# Patient Record
Sex: Female | Born: 1950 | Race: White | Hispanic: No | Smoking: Never smoker
Health system: Southern US, Community
[De-identification: ages and names within clinical notes are randomized; demographics above are authoritative.]

## PROBLEM LIST (undated history)

## (undated) DIAGNOSIS — H269 Unspecified cataract: Secondary | ICD-10-CM

## (undated) DIAGNOSIS — E785 Hyperlipidemia, unspecified: Secondary | ICD-10-CM

## (undated) DIAGNOSIS — F32A Depression, unspecified: Secondary | ICD-10-CM

## (undated) DIAGNOSIS — F419 Anxiety disorder, unspecified: Secondary | ICD-10-CM

## (undated) DIAGNOSIS — I7090 Unspecified atherosclerosis: Secondary | ICD-10-CM

## (undated) DIAGNOSIS — G709 Myoneural disorder, unspecified: Secondary | ICD-10-CM

## (undated) DIAGNOSIS — M1990 Unspecified osteoarthritis, unspecified site: Secondary | ICD-10-CM

## (undated) DIAGNOSIS — E079 Disorder of thyroid, unspecified: Secondary | ICD-10-CM

## (undated) HISTORY — DX: Unspecified osteoarthritis, unspecified site: M19.90

## (undated) HISTORY — DX: Disorder of thyroid, unspecified: E07.9

## (undated) HISTORY — PX: RHYTIDECTOMY NECK / CHEEK / CHIN: SUR1286

## (undated) HISTORY — DX: Hyperlipidemia, unspecified: E78.5

## (undated) HISTORY — DX: Unspecified cataract: H26.9

## (undated) HISTORY — DX: Unspecified atherosclerosis: I70.90

## (undated) HISTORY — DX: Depression, unspecified: F32.A

## (undated) HISTORY — PX: EYE SURGERY: SHX253

## (undated) HISTORY — PX: COSMETIC SURGERY: SHX468

## (undated) HISTORY — DX: Anxiety disorder, unspecified: F41.9

## (undated) HISTORY — PX: TUBAL LIGATION: SHX77

## (undated) HISTORY — DX: Myoneural disorder, unspecified: G70.9

## (undated) HISTORY — PX: ABDOMINAL HYSTERECTOMY: SHX81

---

## 2018-02-14 HISTORY — PX: OTHER SURGICAL HISTORY: SHX169

## 2020-07-09 HISTORY — PX: EUS: SHX5427

## 2021-02-25 ENCOUNTER — Encounter: Payer: Self-pay | Admitting: Neurology

## 2021-03-05 ENCOUNTER — Encounter: Payer: Self-pay | Admitting: Neurology

## 2021-03-09 ENCOUNTER — Ambulatory Visit (INDEPENDENT_AMBULATORY_CARE_PROVIDER_SITE_OTHER): Payer: Medicare Other | Admitting: Family

## 2021-03-09 ENCOUNTER — Other Ambulatory Visit: Payer: Self-pay

## 2021-03-09 ENCOUNTER — Encounter: Payer: Self-pay | Admitting: Family

## 2021-03-09 VITALS — BP 96/62 | HR 69 | Temp 98.2°F | Ht 61.75 in | Wt 117.6 lb

## 2021-03-09 DIAGNOSIS — E039 Hypothyroidism, unspecified: Secondary | ICD-10-CM | POA: Insufficient documentation

## 2021-03-09 DIAGNOSIS — F4321 Adjustment disorder with depressed mood: Secondary | ICD-10-CM

## 2021-03-09 DIAGNOSIS — Z1231 Encounter for screening mammogram for malignant neoplasm of breast: Secondary | ICD-10-CM | POA: Diagnosis not present

## 2021-03-09 DIAGNOSIS — N390 Urinary tract infection, site not specified: Secondary | ICD-10-CM | POA: Diagnosis not present

## 2021-03-09 DIAGNOSIS — E782 Mixed hyperlipidemia: Secondary | ICD-10-CM | POA: Insufficient documentation

## 2021-03-09 DIAGNOSIS — I700 Atherosclerosis of aorta: Secondary | ICD-10-CM

## 2021-03-09 DIAGNOSIS — Z780 Asymptomatic menopausal state: Secondary | ICD-10-CM

## 2021-03-09 DIAGNOSIS — N951 Menopausal and female climacteric states: Secondary | ICD-10-CM | POA: Insufficient documentation

## 2021-03-09 DIAGNOSIS — K760 Fatty (change of) liver, not elsewhere classified: Secondary | ICD-10-CM | POA: Insufficient documentation

## 2021-03-09 DIAGNOSIS — K8689 Other specified diseases of pancreas: Secondary | ICD-10-CM

## 2021-03-09 LAB — POCT URINALYSIS DIPSTICK
Bilirubin, UA: NEGATIVE
Blood, UA: NEGATIVE
Glucose, UA: NEGATIVE
Ketones, UA: NEGATIVE
Leukocytes, UA: NEGATIVE
Nitrite, UA: NEGATIVE
Protein, UA: NEGATIVE
Spec Grav, UA: 1.01 (ref 1.010–1.025)
Urobilinogen, UA: 0.2 E.U./dL
pH, UA: 8 (ref 5.0–8.0)

## 2021-03-09 LAB — LIPID PANEL
Cholesterol: 154 mg/dL (ref 0–200)
HDL: 54.4 mg/dL (ref >39.00)
LDL Cholesterol: 76 mg/dL (ref 0–99)
NonHDL: 99.56
Total CHOL/HDL Ratio: 3
Triglycerides: 118 mg/dL (ref 0.0–149.0)
VLDL: 23.6 mg/dL (ref 0.0–40.0)

## 2021-03-09 LAB — COMPREHENSIVE METABOLIC PANEL
ALT: 27 U/L (ref 0–35)
AST: 25 U/L (ref 0–37)
Albumin: 4.6 g/dL (ref 3.5–5.2)
Alkaline Phosphatase: 52 U/L (ref 39–117)
BUN: 15 mg/dL (ref 6–23)
CO2: 34 mEq/L — ABNORMAL HIGH (ref 19–32)
Calcium: 9.9 mg/dL (ref 8.4–10.5)
Chloride: 99 mEq/L (ref 96–112)
Creatinine, Ser: 0.65 mg/dL (ref 0.40–1.20)
GFR: 87.75 mL/min (ref >60.00)
Glucose, Bld: 96 mg/dL (ref 70–99)
Potassium: 5.1 mEq/L (ref 3.5–5.1)
Sodium: 139 mEq/L (ref 135–145)
Total Bilirubin: 0.4 mg/dL (ref 0.2–1.2)
Total Protein: 7.2 g/dL (ref 6.0–8.3)

## 2021-03-09 LAB — TSH: TSH: 1.21 u[IU]/mL (ref 0.35–5.50)

## 2021-03-09 LAB — T4, FREE: Free T4: 0.92 ng/dL (ref 0.60–1.60)

## 2021-03-09 MED ORDER — ESTRADIOL 0.1 MG/GM VA CREA: TOPICAL_CREAM | VAGINAL | 0 refills | Status: DC

## 2021-03-09 NOTE — Assessment & Plan Note (Signed)
pt has modified her diet. weight wnl, taking Crestor qd

## 2021-03-09 NOTE — Assessment & Plan Note (Signed)
Chronic - stable on Crestor, rechecking labs today.

## 2021-03-09 NOTE — Assessment & Plan Note (Signed)
reports needing several different abt to cure last bad UTI end of last year, had not had any for years prior to that. Having mild dysuria today w/cloudy urine, will check UA, advised on most likely causes for UTI and she would like to try vaginal estrace as preventative.

## 2021-03-09 NOTE — Assessment & Plan Note (Signed)
w/Hepatic steatosis, denies any current sx, wants to be under GI care, states she was told she needs endoscopy yearly.

## 2021-03-09 NOTE — Patient Instructions (Addendum)
Welcome to [facility]! It was a pleasure meeting you today.  Go to the lab for blood work today.  As discussed, I have ordered your Mammogram and bone density scans. A referral has been sent to gastroenterology.  Please schedule a 3 month follow up visit today for medicine refills.    PLEASE NOTE:  If you had any LAB tests please let us know if you have not heard back within a few days. You may see your results on MyChart before we have a chance to review them but we will give you a call once they are reviewed by [place]. If we ordered any REFERRALS today, please let us know if you have not heard from their office within the next week.  Let us know through MyChart if you are needing REFILLS, or have your pharmacy send [place] the request. You can also use MyChart to communicate with me or any office staff.  Please try these tips to maintain a healthy lifestyle:  Eat most of your calories during the day when you are active. Eliminate processed foods including packaged sweets (pies, cakes, cookies), reduce intake of potatoes, white bread, white pasta, and white rice. Look for whole grain options, oat flour or almond flour.  Each meal should contain half fruits/vegetables, one quarter protein, and one quarter carbs (no bigger than a computer mouse).  Cut down on sweet beverages. This includes juice, soda, and sweet tea. Also watch fruit intake, though this is a healthier sweet option, it still contains natural sugar! Limit to 3 servings daily.  Drink at least 1 glass of water with each meal and aim for at least 8 glasses per day  Exercise at least 150 minutes every week.

## 2021-03-09 NOTE — Assessment & Plan Note (Signed)
vaginal dryness & recurrent UTIs, starting Estrace cream, advised on use & SE.

## 2021-03-09 NOTE — Assessment & Plan Note (Signed)
reports having off & on sx over last 6 years; she relocated here last year, has a very sick son who is moving to this area also soon, and she is having worsening sx. She is interested in psychotherapy - "talk therapy", no meds, as she has had in past with bad SE.

## 2021-03-09 NOTE — Progress Notes (Signed)
New Patient Office Visit  Subjective:  Patient ID: Linda Mcmillan, female    DOB: 19-Jul-1950  Age: 71 y.o. MRN: [id number]  CC:  Chief Complaint  Patient presents with   Establish Care   Vaginal odor    Mild; States that this started in Sept., treated, and sx are returning    Dysuria    Mild; States that this started in Sept., treated, but sx are returning   Labs    She is requesting TSH, A1C, Liver panel   Depression   Hypothyroidism   Hyperlipidemia    HPI Linda Mcmillan presents for establishing care  Hypothyroidism: Patient presents today for followup of Hypothyroidism.  Patient reports positive compliance with daily medication: Synthroid Patient denies any of the following symptoms: fatigue, cold intolerance, constipation, weight gain or inability to lose weight, muscle weakness, mental slowing, dry hair and skin. Urinary symptoms: Patient c/o  dysuria, frequency, urgency, foul odor, cloudy urine. Other sx: vaginal d/c none, vaginal itching none. Duration of sx: 1 week; Home tx: none; Denies  nausea, fever. Reports last UTI 10/2020.   Hyperlipidemia: Patient is currently maintained on the following medication for hyperlipidemia: Crestor. Patient denies myalgia or other side effects. Patient reports good compliance with low fat/low cholesterol diet.  Last lipid panel wnl. Anxiety/Depression: Patient complains of anxiety disorder and mild depression.  She has the following symptoms: feeling overwhelmed, lot of worry, feeling sad.  Onset of symptoms was approximately 6 years ago, She denies current suicidal and homicidal ideation.  Possible organic causes contributing are: none.  Risk factors: negative life event son's illness   Previous treatment includes  different medications  and individual therapy.  She complains of the following side effects from the treatment:  variety of side effects . Depression screen [facility] 2/9 03/09/2021  Decreased Interest 0  Down, Depressed, Hopeless 1   PHQ - 2 Score 1  Altered sleeping 0  Tired, decreased energy 0  Change in appetite 0  Feeling bad or failure about yourself  0  Trouble concentrating 0  Moving slowly or fidgety/restless 0  Suicidal thoughts 0  PHQ-9 Score 1        Past Medical History:  Diagnosis Date   Anxiety 2018   Arthritis Neck, back (herniated lumbar disc   Atherosclerosis    Cataract Surgery, 2020   Depression 2016   Hyperlipidemia 10 years   Neuromuscular disorder (HCC) Restless leg syndrome, 10 years    Past Surgical History:  Procedure Laterality Date   ABDOMINAL HYSTERECTOMY  15 years ago    Family History  Problem Relation Age of Onset   Arthritis Mother    Cancer Mother    Diabetes Mother    Varicose Veins Mother    Alcohol abuse Father    Varicose Veins Maternal Grandmother    ADD / ADHD Son    Alcohol abuse Son    Depression Son    Depression Son     Social History   Socioeconomic History   Marital status: Divorced    Spouse name: Not on file   Number of children: Not on file   Years of education: Not on file   Highest education level: Not on file  Occupational History   Not on file  Tobacco Use   Smoking status: Never   Smokeless tobacco: Not on file  Substance and Sexual Activity   Alcohol use: Never   Drug use: Never   Sexual activity: Not Currently  Other Topics Concern  Not on file  Social History Narrative   Not on file   Social Determinants of Health   Financial Resource Strain: Not on file  Food Insecurity: Not on file  Transportation Needs: Not on file  Physical Activity: Not on file  Stress: Not on file  Social Connections: Not on file  Intimate Partner Violence: Not on file    Objective:   Today's Vitals: BP 96/62    Pulse 69    Temp 98.2 F (36.8 C) (Temporal)    Ht 5' 1.75" (1.568 m)    Wt 117 lb 9.6 oz (53.3 kg)    SpO2 96%    BMI 21.68 kg/m   Physical Exam Vitals and nursing note reviewed.  Constitutional:      Appearance: Normal  appearance.  Cardiovascular:     Rate and Rhythm: Normal rate and regular rhythm.  Pulmonary:     Effort: Pulmonary effort is normal.     Breath sounds: Normal breath sounds.  Musculoskeletal:        General: Normal range of motion.  Skin:    General: Skin is warm and dry.  Neurological:     Mental Status: She is alert.  Psychiatric:        Mood and Affect: Mood normal.        Behavior: Behavior normal.    Assessment & Plan:   Problem List Items Addressed This Visit       Cardiovascular and Mediastinum   Atherosclerosis of aorta [facility])     Digestive   Dilated pancreatic duct    w/Hepatic steatosis, denies any current sx, wants to be under GI care, states she was told she needs endoscopy yearly.       Relevant Orders   Ambulatory referral to Gastroenterology     Endocrine   Acquired hypothyroidism    Chronic - requires brand, Synthroid, reports about a year since last labs, checking today.      Relevant Orders   T4, free (Completed)   TSH (Completed)     Genitourinary   Frequent UTI - Primary    reports needing several different abt to cure last bad UTI end of last year, had not had any for years prior to that. Having mild dysuria today w/cloudy urine, will check UA, advised on most likely causes for UTI and she would like to try vaginal estrace as preventative.      Relevant Orders   POCT Urinalysis Dipstick (Completed)     Other   Mixed hyperlipidemia    Chronic - stable on Crestor, rechecking labs today.      Relevant Orders   Lipid panel (Completed)   Comp Met (CMET) (Completed)   Adjustment disorder with depressed mood    reports having off & on sx over last 6 years; she relocated here last year, has a very sick son who is moving to this area also soon, and she is having worsening sx. She is interested in psychotherapy - "talk therapy", no meds, as she has had in past with bad SE.       Relevant Orders   Ambulatory referral to Psychology    Postmenopausal estrogen deficiency    vaginal dryness & recurrent UTIs, starting Estrace cream, advised on use & SE.      Relevant Medications   estradiol (ESTRACE) 0.1 MG/GM vaginal cream   Other Relevant Orders   DG Bone Density   Other Visit Diagnoses     Encounter for screening mammogram for breast  cancer       Relevant Orders   MM Digital Screening       Outpatient Encounter Medications as of 03/09/2021  Medication Sig   estradiol (ESTRACE) 0.1 MG/GM vaginal cream 0.5 g intravaginally 3 times per week.   No facility-administered encounter medications on file as of 03/09/2021.    Follow-up: No follow-ups on file.   Jeanie Sewer, NP

## 2021-03-09 NOTE — Assessment & Plan Note (Signed)
Chronic - requires brand, Synthroid, reports about a year since last labs, checking today.

## 2021-03-11 NOTE — Progress Notes (Signed)
[facility] Clinic Note - Initial Visit   Date: 03/12/21  Linda Mcmillan MRN: [id number] DOB: [birth year]   Dear Markus Jarvis, MD:  Thank you for your kind referral of Linda Mcmillan for consultation of restless leg syndrome. Although her history is well known to you, please allow [place] to reiterate it for the purpose of our medical record. The patient was accompanied to the clinic by self.   History of Present Illness: Linda Mcmillan is a 71 y.o. right-handed female with hyperlipidemia and hypothyroidism presenting for evaluation of restless leg syndrome.   For the last 12 years, she has restless sensation of the feet.  Symptoms are worse at rest and improved with activity. She has previously tried: pramipexole, oxycodone and was followed at [facility] in 2016. After moving to [place], [place] she established care with Dr. Volanda Napoleon at [facility] and more recently followed by Dr. Lelon Mast.  For the past two years, she has been on gabapentin 600mg  at 6p and 10p, ropinirole 1mg  at 6p, and clonazepam 0.5mg  at bedtime.  Most of the time (95%), her RLS is well-controlled on this regimen. She rarely needs to get up at night to walk around.  She moved to [place] from [place], [place] in December and would like to establish care.  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 She lives alone.    Out-side paper records, electronic medical record, and images have been reviewed where available and summarized as:  Lab Results  Component Value Date   TSH 1.21 03/09/2021   No results found for: ESRSEDRATE, POCTSEDRATE  Past Medical History:  Diagnosis Date   Anxiety 2018   Arthritis Neck, back (herniated lumbar disc   Atherosclerosis    Cataract Surgery, 2020   Depression 2016   Hyperlipidemia 10 years   Neuromuscular disorder ([place]) Restless leg syndrome, 10 years     Past Surgical History:  Procedure Laterality Date   ABDOMINAL HYSTERECTOMY  20 years ago     Medications:  Outpatient Encounter Medications as of 03/12/2021  Medication Sig   clonazePAM (KLONOPIN) 0.5 MG tablet See Instructions, Qty: 180 Tab, Refills: 2, Take 1/2 tab to 2 tabs 30 min before bedtime per instructions from doctor's office, Maintenance, Route to Pharmacy Electronically, [place] [phone], Restless leg syndrome   estradiol (ESTRACE) 0.1 MG/GM vaginal cream 0.5 g intravaginally 3 times per week.   gabapentin (NEURONTIN) 600 MG tablet See Instructions, Qty: 135 Tab, Refills: 3, Take 1/2  tablet at 8 pm and 1 tab at 10 pm, Maintenance, Route to Pharmacy Electronically, [place] [phone]   levothyroxine (SYNTHROID) 75 MCG tablet Synthroid 75 mcg tablet   rOPINIRole (REQUIP) 1 MG tablet See Instructions, Qty: 90 Tab, Refills: 2, TAKE ONE TABLET BY MOUTH EVERY NIGHT AT BEDTIME, 90, Route to Pharmacy Electronically, [place] [phone]   rosuvastatin (CRESTOR) 10 MG tablet Take 1 tablet by mouth daily.   No facility-administered encounter medications on file as of 03/12/2021.    Allergies:  Allergies  Allergen Reactions   Sulfa Antibiotics     Family History: Family History  Problem Relation Age of Onset   Arthritis Mother    Cancer Mother        Colon Cancer at 37 years old   Diabetes Mother    Varicose Veins Mother    Alcohol abuse Father    Other Father  Heavy Smoker   Varicose Veins Maternal Grandmother    Other Son        PTSD from [place] War   ADD / ADHD Son    Alcohol abuse Son    Depression Son    Depression Son     Social History: Social History   Tobacco Use   Smoking status: Never   Smokeless tobacco: Never  Substance Use Topics   Alcohol use: Never   Drug use: Never   Social History   Social History Narrative   Right Handed    Lives in a one story home    Vital Signs:  BP 103/62    Pulse 68    Ht 5' 1.75" (1.568 m)     Wt 118 lb (53.5 kg)    SpO2 97%    BMI 21.76 kg/m    Neurological Exam: MENTAL STATUS including orientation to time, place, person, recent and remote memory, attention span and concentration, language, and fund of knowledge is normal.  Speech is not dysarthric.  CRANIAL NERVES: II:  No visual field defects.  III-IV-VI: Pupils equal round and reactive to light.  Normal conjugate, extra-ocular eye movements in all directions of gaze.  No nystagmus.  No ptosis.   V:  Normal facial sensation.    VII:  Normal facial symmetry and movements.   VIII:  Normal hearing and vestibular function.   IX-X:  Normal palatal movement.   XI:  Normal shoulder shrug and head rotation.   XII:  Normal tongue strength and range of motion, no deviation or fasciculation.  MOTOR: Motor strength is 5/5 throughout.  No atrophy, fasciculations or abnormal movements.  No pronator drift.   MSRs:  Right        Left                  brachioradialis 2+  2+  biceps 2+  2+  triceps 2+  2+  patellar 2+  2+  ankle jerk 2+  2+  Hoffman no  no  plantar response down  down   SENSORY:  Normal and symmetric perception of light touch, pinprick, vibration, and proprioception.  Romberg's sign absent.   COORDINATION/GAIT: Normal finger-to- nose-finger and heel-to-shin.  Intact rapid alternating movements bilaterally.  Gait narrow based and stable. Tandem and stressed gait intact.    IMPRESSION: Restless leg syndrome, well-controlled.  - Previously tried:  pramipexole, oxycodone - Continue gabapentin 600mg  at 6p and 8p - refilled - Continue ropinirole 1mg  at bedtime - refilled - Continue clonazepam 0.5mg  at bedtime - refilled  Return to clinic in 1 year.   Thank you for allowing me to participate in patient's care.  If I can answer any additional questions, I would be pleased to do so.    Sincerely,    Rustyn Conery K. Posey Pronto, DO

## 2021-03-12 ENCOUNTER — Ambulatory Visit (INDEPENDENT_AMBULATORY_CARE_PROVIDER_SITE_OTHER): Payer: Medicare Other | Admitting: Neurology

## 2021-03-12 ENCOUNTER — Encounter: Payer: Self-pay | Admitting: Neurology

## 2021-03-12 ENCOUNTER — Other Ambulatory Visit: Payer: Self-pay

## 2021-03-12 VITALS — BP 103/62 | HR 68 | Ht 61.75 in | Wt 118.0 lb

## 2021-03-12 DIAGNOSIS — G2581 Restless legs syndrome: Secondary | ICD-10-CM | POA: Diagnosis not present

## 2021-03-12 MED ORDER — GABAPENTIN 600 MG PO TABS: ORAL_TABLET | ORAL | 3 refills | Status: DC

## 2021-03-12 MED ORDER — ROPINIROLE HCL 1 MG PO TABS: ORAL_TABLET | ORAL | 3 refills | Status: DC

## 2021-03-12 MED ORDER — CLONAZEPAM 0.5 MG PO TABS: 0.5000 mg | ORAL_TABLET | Freq: Every day | ORAL | 5 refills | Status: DC

## 2021-03-12 NOTE — Patient Instructions (Addendum)
Continue gabapentin 600mg  at 6p and 8p  Continue ropinirole 1mg  at bedtime  Continue clonazepam 0.5mg  at bedtime  Return to clinic in 1 year

## 2021-03-14 ENCOUNTER — Encounter: Payer: Self-pay | Admitting: Family

## 2021-03-14 DIAGNOSIS — R7303 Prediabetes: Secondary | ICD-10-CM

## 2021-03-15 ENCOUNTER — Other Ambulatory Visit: Payer: Self-pay

## 2021-03-15 DIAGNOSIS — Z87898 Personal history of other specified conditions: Secondary | ICD-10-CM

## 2021-03-16 ENCOUNTER — Ambulatory Visit (INDEPENDENT_AMBULATORY_CARE_PROVIDER_SITE_OTHER): Payer: Medicare Other | Admitting: Psychology

## 2021-03-16 ENCOUNTER — Other Ambulatory Visit: Payer: Self-pay

## 2021-03-16 DIAGNOSIS — F4321 Adjustment disorder with depressed mood: Secondary | ICD-10-CM
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
## 2021-03-30 ENCOUNTER — Telehealth: Payer: Self-pay | Admitting: Family

## 2021-03-30 ENCOUNTER — Other Ambulatory Visit: Payer: Self-pay

## 2021-03-30 ENCOUNTER — Encounter: Payer: Self-pay | Admitting: Family

## 2021-03-30 ENCOUNTER — Other Ambulatory Visit (INDEPENDENT_AMBULATORY_CARE_PROVIDER_SITE_OTHER): Payer: Medicare Other

## 2021-03-30 DIAGNOSIS — I700 Atherosclerosis of aorta: Secondary | ICD-10-CM

## 2021-03-30 DIAGNOSIS — R7303 Prediabetes: Secondary | ICD-10-CM

## 2021-03-30 LAB — HEMOGLOBIN A1C: Hgb A1c MFr Bld: 5.9 % (ref 4.6–6.5)

## 2021-03-30 NOTE — Telephone Encounter (Signed)
Patients las DOS 03/09/21. Patient would like to be referred to a cardiologist. Does she need to have another office visit for this referral? She had a cardiologist in [place] and needs to establish care here in [place].

## 2021-03-30 NOTE — Telephone Encounter (Signed)
Lvm to make pt aware of new Cardiology referral.

## 2021-04-01 ENCOUNTER — Ambulatory Visit (INDEPENDENT_AMBULATORY_CARE_PROVIDER_SITE_OTHER): Payer: Medicare Other | Admitting: Psychology

## 2021-04-01 DIAGNOSIS — F4321 Adjustment disorder with depressed mood: Secondary | ICD-10-CM

## 2021-04-01 NOTE — Progress Notes (Signed)
[facility] Counselor/Therapist Progress Note ° °Patient ID: Linda Mcmillan, MRN: [id number]  ° °Date: 04/01/21 ° °Time Spent: 9:03  am - 9:53 am : 50 Minutes ° °Treatment Type: Individual Therapy. ° °Reported Symptoms: Anxiety and Depression. ° °Mental Status Exam: °Appearance:  Casual, Neat, and Well Groomed     °Behavior: Appropriate  °Motor: Normal  °Speech/Language:  Normal Rate  °Affect: Congruent  °Mood: normal  °Thought process: normal  °Thought content:   WNL  °Sensory/Perceptual disturbances:   WNL  °Orientation: oriented to person, place, time/date, and situation  °Attention: Good  °Concentration: Good  °Memory: WNL  °Fund of knowledge:  Good  °Insight:   Good  °Judgment:  Good  °Impulse Control: Good  ° °Risk Assessment: °Danger to Self:  No °Self-injurious Behavior: No °Danger to Others: No °Duty to Warn:no °Physical Aggression / Violence:No  °Access to Firearms a concern: No  °Gang Involvement:No  ° °Subjective:  ° °[name] participated from home, via video and consented to treatment. Therapist participated from home office. We met online due to COVID pandemic. Keidra reviewed the events of the past week. We reviewed numerous treatment approaches including CBT, BA, Problem Solving, and Solution focused therapy. Psych-education regarding the Kellene's diagnosis of Adjustment disorder with depressed mood was provided during the session. We discussed [name]'s goals treatment goals which include to become more social, develop a support system, adjust to her recent move, increase physical activity,  reduce rumination and catastrophization, process past events (hospitalization), and bolster coping skills. . [name] provided verbal approval of the treatment plan.  ° °Interventions: Psycho-education & Goal Setting.  ° °Diagnosis:  °Adjustment disorder with depressed mood ° °Treatment Plan: ° °Client Abilities/Strengths °Seville is self-ware, motivated, and intelligent. ° °Support  System: °Family ° °Client Treatment Preferences °Outpatient Therapy.  ° °Client Statement of Needs °Issabelle would like to become more social, develop a support system, adjust to her recent move, increase physical activity,  reduce rumination and catastrophization,  °process past events (hospitalization), and bolster coping skills.  ° °Treatment Level °Weekly ° °Symptoms ° °Anxiety: difficulty managing worry and worrying too much about different things   (Status: maintained) °Depression: Feeling down   (Status: maintained) ° °Goals:  °[name] experiences symptoms of depression and anxiety. ° ° °Target Date: 04/01/22 Frequency: Weekly  °Progress: 0 Modality: individual  ° ° °Therapist will provide referrals for additional resources as appropriate.  °Therapist will provide psycho-education regarding [name]'s diagnosis and corresponding treatment approaches and interventions. °Licensed Clinical Social Worker, Osman Mostafa, LCSW will support the patient's ability to achieve the goals identified. will employ CBT, BA, Problem-solving, Solution Focused, Mindfulness,  coping skills, & other evidenced-based practices will be used to promote progress towards healthy functioning to help manage decrease symptoms associated with her diagnosis.  ° Reduce overall level, frequency, and intensity of the feelings of depression and anxiety evidenced by decreased overall symptoms from 6 to 7 days/week to 0 to 1 days/week per client report for at least 3 consecutive months. °Verbally express understanding of the relationship between feelings of depression, anxiety and their impact on thinking patterns and behaviors. °Verbalize an understanding of the role that distorted thinking plays in creating fears, excessive worry, and ruminations. ° ° °([name] participated in the creation of the treatment plan) ° °Osman Mostafa, LCSW °

## 2021-04-02 ENCOUNTER — Ambulatory Visit: Payer: Medicare Other | Admitting: Psychology

## 2021-04-06 NOTE — Progress Notes (Signed)
Cardiology Office Note   Date:  04/13/2021   ID:  Linda Mcmillan, DOB 1950/03/20, MRN [id number]  PCP:  Jeanie Sewer, NP  Cardiologist:   Janitza Revuelta Martinique, MD   Chief Complaint  Patient presents with   Hyperlipidemia      History of Present Illness: Linda Mcmillan is a 71 y.o. female who is seen at the request of Jeanie Sewer NP for evaluation of aortic atherosclerosis. She has a history of HLD. She has previously been followed by Cardiology in [place], [place] - Dr Sonny Masters. History of venous insufficiency. Normal ETT and Echo in Jan 2022.   She was told on some scan that she had atherosclerosis of the aortic arch. She brings reports of a number of scans but none involve the chest. She thinks it may have been a MRI scan she had before at [facility]. She moved to [facility] to be with her son who has a number of health issues including a thoracic aneurysm.  She denies any chest pain, dyspnea, palpitations. She is trying to eliminate sugar in her diet and eat healthy. She has joined a gym. She is a nonsmoker.     Past Medical History:  Diagnosis Date   Anxiety 2018   Arthritis Neck, back (herniated lumbar disc   Atherosclerosis    Cataract Surgery, 2020   Depression 2016   Hyperlipidemia 10 years   Neuromuscular disorder ([place]) Restless leg syndrome, 10 years    Past Surgical History:  Procedure Laterality Date   ABDOMINAL HYSTERECTOMY  20 years ago     Current Outpatient Medications  Medication Sig Dispense Refill   clonazePAM (KLONOPIN) 0.5 MG tablet Take 1 tablet (0.5 mg total) by mouth at bedtime. 30 tablet 5   estradiol (ESTRACE) 0.1 MG/GM vaginal cream 0.5 g intravaginally 3 times per week. 42.5 g 0   gabapentin (NEURONTIN) 600 MG tablet Take 1 tablet at 6p and 8p. 180 tablet 3   levothyroxine (SYNTHROID) 75 MCG tablet Synthroid 75 mcg tablet     rOPINIRole (REQUIP) 1 MG tablet Take 1 tablet at 6p 90 tablet 3   rosuvastatin (CRESTOR) 10 MG tablet Take 1 tablet by mouth  daily.     No current facility-administered medications for this visit.    Allergies:   Sulfa antibiotics    Social History:  The patient  reports that she has never smoked. She has never used smokeless tobacco. She reports that she does not drink alcohol and does not use drugs.   Family History:  The patient's family history includes ADD / ADHD in her son; Alcohol abuse in her father and son; Arthritis in her mother; Cancer in her mother; Depression in her son and son; Diabetes in her mother; Other in her father and son; Varicose Veins in her maternal grandmother and mother.    ROS:  Please see the history of present illness.   Otherwise, review of systems are positive for none.   All other systems are reviewed and negative.    PHYSICAL EXAM: VS:  BP 128/66    Pulse 78    Ht 5\' 2"  (1.575 m)    Wt 120 lb 12.8 oz (54.8 kg)    BMI 22.09 kg/m  , BMI Body mass index is 22.09 kg/m. GEN: Well nourished, thin WF, in no acute distress HEENT: normal Neck: no JVD, carotid bruits, or masses Cardiac: RRR; no murmurs, rubs, or gallops,no edema  Respiratory:  clear to auscultation bilaterally, normal work of breathing GI: soft, nontender,  nondistended, + BS MS: no deformity or atrophy Skin: warm and dry, no rash Neuro:  Strength and sensation are intact Psych: anxious mood, full affect   EKG:  EKG is ordered today. The ekg ordered today demonstrates NSR rate 78. Normal Ecg. I have personally reviewed and interpreted this study.    Recent Labs: 03/09/2021: ALT 27; BUN 15; Creatinine, Ser 0.65; Potassium 5.1; Sodium 139; TSH 1.21    Lipid Panel    Component Value Date/Time   CHOL 154 03/09/2021 1120   TRIG 118.0 03/09/2021 1120   HDL 54.40 03/09/2021 1120   CHOLHDL 3 03/09/2021 1120   VLDL 23.6 03/09/2021 1120   LDLCALC 76 03/09/2021 1120      Wt Readings from Last 3 Encounters:  04/13/21 120 lb 12.8 oz (54.8 kg)  03/12/21 118 lb (53.5 kg)  03/09/21 117 lb 9.6 oz (53.3 kg)       Other studies Reviewed: Additional studies/ records that were reviewed today include:   Echo 02/24/20: Narrative Performed by [facility] This result has an attachment that is not available.    Normal left ventricular size and function. EF 63%    Mild left ventricular hypertrophy.    Normal left ventricular diastolic function.    There is mild tricuspid valve regurgitation.    The RVSP is within normal limits estimated at 25 mmHg  ETT 02/24/20: Study Details  The stress study was performed under the direct supervision of LAWRENCE KLINE DO. Procedure indications: HLD, RBBB, VENOUS INSUFFICIENCY   Resting ECG  ECG is abnormal.nsr inc RBBB Resting ECG shows no ST-segment deviation. The ECG shows normal sinus rhythm.   Stress Findings  Stress testing was performed using a Bruce protocol, with the patient reaching stage 4 for 11 min 45 sec to a maximal HR of 157 (105 % of MPHR) 13.4 METS. Overall, the patient's exercise capacity was excellent for their age. The patient experienced no angina during the test. The test was stopped because the patient experienced fatigue and dyspnea. The patient requested the test to be stopped. The patient reported dyspnea and fatigue during the stress test. Symptoms began at minute 10:02 during stress and ended at minute 3:05 during recovery. Blood pressure demonstrated a normal response and heart rate demonstrated a normal response to stress. The patient's heart rate recovery was normal.   Stress ECG  There was no ST-segment deviation noted during stress. Arrhythmias during stress: rare PACs. There were no arrhythmias during recovery. The ECG was negative for ischemia.  ASSESSMENT AND PLAN:  1.  Aortic atherosclerosis based on patient history. Negative cardiac evaluation last year with Echo and ETT. No cardiac symptoms. Recommend lifestyle modification as she is doing. She is on statin which is appropriate. I don't think she needs to take ASA. Will follow up in  one year. 2. HLD on statin- continue   Current medicines are reviewed at length with the patient today.  The patient does not have concerns regarding medicines.  The following changes have been made:  no change  Labs/ tests ordered today include:  No orders of the defined types were placed in this encounter.        Disposition:   FU with me in 1 year  Signed, Brentlee Delage Martinique, MD  04/13/2021 11:39 AM    [facility] [address], [place], [place], [address] Phone [phone], Fax [phone]

## 2021-04-08 ENCOUNTER — Ambulatory Visit (INDEPENDENT_AMBULATORY_CARE_PROVIDER_SITE_OTHER): Payer: Medicare Other | Admitting: Psychology

## 2021-04-08 DIAGNOSIS — F4321 Adjustment disorder with depressed mood: Secondary | ICD-10-CM | POA: Diagnosis not present
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
## 2021-04-13 ENCOUNTER — Encounter: Payer: Self-pay | Admitting: Cardiology

## 2021-04-13 ENCOUNTER — Ambulatory Visit (INDEPENDENT_AMBULATORY_CARE_PROVIDER_SITE_OTHER): Payer: Medicare Other | Admitting: Cardiology

## 2021-04-13 ENCOUNTER — Other Ambulatory Visit: Payer: Self-pay

## 2021-04-13 VITALS — BP 128/66 | HR 78 | Ht 62.0 in | Wt 120.8 lb

## 2021-04-13 DIAGNOSIS — I700 Atherosclerosis of aorta: Secondary | ICD-10-CM | POA: Diagnosis not present

## 2021-04-13 DIAGNOSIS — E7800 Pure hypercholesterolemia, unspecified: Secondary | ICD-10-CM

## 2021-04-15 ENCOUNTER — Ambulatory Visit (INDEPENDENT_AMBULATORY_CARE_PROVIDER_SITE_OTHER): Payer: Medicare Other | Admitting: Psychology

## 2021-04-15 DIAGNOSIS — F4321 Adjustment disorder with depressed mood: Secondary | ICD-10-CM | POA: Diagnosis not present
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
## 2021-04-19 ENCOUNTER — Encounter: Payer: Self-pay | Admitting: Family

## 2021-04-19 MED ORDER — LEVOTHYROXINE SODIUM 75 MCG PO TABS: ORAL_TABLET | ORAL | 1 refills | Status: DC

## 2021-04-20 ENCOUNTER — Ambulatory Visit: Payer: Medicare Other | Admitting: Psychology

## 2021-04-20 ENCOUNTER — Other Ambulatory Visit: Payer: Self-pay

## 2021-04-20 ENCOUNTER — Ambulatory Visit (INDEPENDENT_AMBULATORY_CARE_PROVIDER_SITE_OTHER): Payer: Medicare Other | Admitting: Psychology

## 2021-04-20 DIAGNOSIS — F4321 Adjustment disorder with depressed mood: Secondary | ICD-10-CM | POA: Diagnosis not present
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
## 2021-04-27 ENCOUNTER — Other Ambulatory Visit: Payer: Self-pay

## 2021-04-27 ENCOUNTER — Ambulatory Visit (INDEPENDENT_AMBULATORY_CARE_PROVIDER_SITE_OTHER): Payer: Medicare Other | Admitting: Psychology

## 2021-04-27 DIAGNOSIS — F4321 Adjustment disorder with depressed mood: Secondary | ICD-10-CM | POA: Diagnosis not present

## 2021-04-27 NOTE — Progress Notes (Signed)
[name] Counselor/Therapist Progress Note ? ?Patient ID: Linda Mcmillan, MRN: [id number]  ? ?Date: 04/27/21 ? ?Time Spent: 2:01  pm - 2:53 pm : 52 Minutes ? ?Treatment Type: Individual Therapy. ? ?Reported Symptoms: Anxiety and Depression. ? ?Mental Status Exam: ?Appearance:  Casual, Neat, and Well Groomed     ?Behavior: Appropriate  ?Motor: Normal  ?Speech/Language:  Normal Rate  ?Affect: Congruent  ?Mood: normal  ?Thought process: normal  ?Thought content:   WNL  ?Sensory/Perceptual disturbances:   WNL  ?Orientation: oriented to person, place, time/date, and situation  ?Attention: Good  ?Concentration: Good  ?Memory: WNL  ?Fund of knowledge:  Good  ?Insight:   Good  ?Judgment:  Good  ?Impulse Control: Good  ? ?Risk Assessment: ?Danger to Self:  No ?Self-injurious Behavior: No ?Danger to Others: No ?Duty to Warn:no ?Physical Aggression / Violence:No  ?Access to Firearms a concern: No  ?Gang Involvement:No  ? ?Subjective:  ? ?Linda Mcmillan participated from home, via phone and consented to treatment. Therapist participated from home office. We met online due to [place] pandemic. Linda Mcmillan reviewed the events of the past week.  Linda Mcmillan endorsed daily tearfulness.  She noted "I cry a lot, probably every day" but could not identify a reason for her tearfulness.  We worked on identifying the effects of this on her overall mood and possible contributing factors to her tearfulness.  She noted her worry regarding this being the medication issues specifically her Klonopin prescription due to past experience with oxycodone.  We processed this during the session and therapist encouraged Linda Mcmillan to contact her prescriber to discuss concerns.  We will continue to process through relationship with her sisters and mother.  Linda Mcmillan noted that her mother "never stood up for me" with her father when her father suspected that Linda Mcmillan was not his biological daughter, which was untrue.  Linda Mcmillan discussed her mother's avoidance of  rectifying her addressing this issue and noted her mother's excuses including "well you never got along with anyway".  We continued to process this relationship and her recent transitions which could have resulted in her daily tearfulness.  Linda Mcmillan discussed a feeling of lack of support in the area, as she is a recent transplant, and we worked on processing this as well.  Therapist encouraged Linda Mcmillan to work on identifying her support system within the area, to build relationships, to spend more time outside the home interacting socially.  Linda Mcmillan was engaged and motivated during the session and she expressed commitment towards her goals.  Therapist validated Linda Mcmillan's feelings and provided supportive therapy.  Follow-up was previously scheduled. ? ?Interventions: Interpersonal / Family Systems.  ? ?Diagnosis:  ?Adjustment disorder with depressed mood ? ?Treatment Plan: ? ?Client Abilities/Strengths ?Linda Mcmillan is self-ware, motivated, and intelligent. ? ?Support System: ?Family ? ?Client Treatment Preferences ?Outpatient Therapy.  ? ?Client Statement of Needs ?Linda Mcmillan would like to become more social, develop a support system, adjust to her recent move, increase physical activity,  reduce rumination and catastrophization,  ?process past events (hospitalization), and bolster coping skills.  ? ?Treatment Level ?Weekly ? ?Symptoms ? ?Anxiety: difficulty managing worry and worrying too much about different things   (Status: maintained) ?Depression: Feeling down   (Status: maintained) ? ?Goals:  ?Linda Mcmillan experiences symptoms of depression and anxiety. ? ? ?Target Date: 04/01/22 Frequency: Weekly  ?Progress: 0 Modality: individual  ? ? ?Therapist will provide referrals for additional resources as appropriate.  ?Therapist will provide psycho-education regarding Linda Mcmillan's diagnosis and corresponding treatment approaches and interventions. ?Licensed Clinical  Social Worker, Linda Irish, LCSW will support the patient's ability to  achieve the goals identified. will employ CBT, BA, Problem-solving, Solution Focused, Mindfulness,  coping skills, & other evidenced-based practices will be used to promote progress towards healthy functioning to help manage decrease symptoms associated with her diagnosis.  ? Reduce overall level, frequency, and intensity of the feelings of depression and anxiety evidenced by decreased overall symptoms from 6 to 7 days/week to 0 to 1 days/week per client report for at least 3 consecutive months. ?Verbally express understanding of the relationship between feelings of depression, anxiety and their impact on thinking patterns and behaviors. ?Verbalize an understanding of the role that distorted thinking plays in creating fears, excessive worry, and ruminations. ? ? ?(Linda Mcmillan participated in the creation of the treatment plan) ? ? ?Linda Irish, LCSW ?

## 2021-04-30 ENCOUNTER — Ambulatory Visit: Payer: Self-pay | Admitting: Neurology

## 2021-05-04 ENCOUNTER — Ambulatory Visit (INDEPENDENT_AMBULATORY_CARE_PROVIDER_SITE_OTHER): Payer: Medicare Other | Admitting: Psychology

## 2021-05-04 ENCOUNTER — Other Ambulatory Visit: Payer: Self-pay

## 2021-05-04 DIAGNOSIS — F4321 Adjustment disorder with depressed mood: Secondary | ICD-10-CM

## 2021-05-04 NOTE — Progress Notes (Signed)
[facility] Counselor/Therapist Progress Note ? ?Patient ID: Linda Mcmillan, MRN: [id number]  ? ?Date: 05/04/21 ? ?Time Spent: 2:01  pm - 2:56 pm : 55 Minutes ? ?Treatment Type: Individual Therapy. ? ?Reported Symptoms: Anxiety and Depression. ? ?Mental Status Exam: ?Appearance:  Casual, Neat, and Well Groomed     ?Behavior: Appropriate  ?Motor: Normal  ?Speech/Language:  Normal Rate  ?Affect: Congruent  ?Mood: anxious  ?Thought process: normal  ?Thought content:   WNL  ?Sensory/Perceptual disturbances:   WNL  ?Orientation: oriented to person, place, time/date, and situation  ?Attention: Good  ?Concentration: Good  ?Memory: WNL  ?Fund of knowledge:  Good  ?Insight:   Good  ?Judgment:  Good  ?Impulse Control: Good  ? ?Risk Assessment: ?Danger to Self:  No ?Self-injurious Behavior: No ?Danger to Others: No ?Duty to Warn:no ?Physical Aggression / Violence:No  ?Access to Firearms a concern: No  ?Gang Involvement:No  ? ?Subjective:  ? ?[name] participated in the office with therapist. Janetta reviewed the events of the past week. Deloros noted reflecting on the cost of her current familial strain. She noted reflecting on her living situation and feeling a general state of dissatisfaction. She endorsed anxiety regarding her son's arrival to [facility], for a permanent move. She discussed her specific worries regarding this transition. We discussed additional boundaries, going forward, to attempt to minimize interpersonal stressors.Therapist modeled boundary setting using assertive and positive communication. Celestine continued to endorse tearfulness but noted this occurring less frequently. We worked on exploring this during the session. She discussed her fear regarding this being a result of her medication. Therapist encouraged mindfulness regarding possible side-effects and to contact her prescriber.   Treesa was engaged and motivated during the session and she expressed commitment towards her goals.  Therapist  validated Jule's feelings and provided supportive therapy.  Follow-up was previously scheduled. ? ?Interventions: Interpersonal / Family Systems.  ? ?Diagnosis:  ?Adjustment disorder with depressed mood ? ?Treatment Plan: ? ?Client Abilities/Strengths ?Belissa is self-ware, motivated, and intelligent. ? ?Support System: ?Family ? ?Client Treatment Preferences ?Outpatient Therapy.  ? ?Client Statement of Needs ?Davonna would like to become more social, develop a support system, adjust to her recent move, increase physical activity,  reduce rumination and catastrophization,  ?process past events (hospitalization), and bolster coping skills.  ? ?Treatment Level ?Weekly ? ?Symptoms ? ?Anxiety: difficulty managing worry and worrying too much about different things   (Status: maintained) ?Depression: Feeling down   (Status: maintained) ? ?Goals:  ?Niasha experiences symptoms of depression and anxiety. ? ? ?Target Date: 04/01/22 Frequency: Weekly  ?Progress: 0 Modality: individual  ? ? ?Therapist will provide referrals for additional resources as appropriate.  ?Therapist will provide psycho-education regarding Kuulei's diagnosis and corresponding treatment approaches and interventions. ?Licensed Clinical Social Worker, [name], LCSW will support the patient's ability to achieve the goals identified. will employ CBT, BA, Problem-solving, Solution Focused, Mindfulness,  coping skills, & other evidenced-based practices will be used to promote progress towards healthy functioning to help manage decrease symptoms associated with her diagnosis.  ? Reduce overall level, frequency, and intensity of the feelings of depression and anxiety evidenced by decreased overall symptoms from 6 to 7 days/week to 0 to 1 days/week per client report for at least 3 consecutive months. ?Verbally express understanding of the relationship between feelings of depression, anxiety and their impact on thinking patterns and behaviors. ?Verbalize an  understanding of the role that distorted thinking plays in creating fears, excessive worry, and ruminations. ? ? ?(  Dayna participated in the creation of the treatment plan) ? ? ?[name], LCSW ?

## 2021-05-11 ENCOUNTER — Ambulatory Visit (INDEPENDENT_AMBULATORY_CARE_PROVIDER_SITE_OTHER): Payer: Medicare Other | Admitting: Psychology

## 2021-05-11 ENCOUNTER — Other Ambulatory Visit: Payer: Self-pay

## 2021-05-11 DIAGNOSIS — F4321 Adjustment disorder with depressed mood: Secondary | ICD-10-CM | POA: Diagnosis not present

## 2021-05-11 NOTE — Progress Notes (Signed)
[facility] Counselor/Therapist Progress Note ? ?Patient ID: Linda Mcmillan, MRN: [id number]  ? ?Date: 05/11/21 ? ?Time Spent: 2:02  pm - 2:57 pm : 55 Minutes ? ?Treatment Type: Individual Therapy. ? ?Reported Symptoms: Anxiety and Depression. ? ?Mental Status Exam: ?Appearance:  Casual, Neat, and Well Groomed     ?Behavior: Appropriate  ?Motor: Normal  ?Speech/Language:  Normal Rate  ?Affect: Congruent  ?Mood: anxious  ?Thought process: normal  ?Thought content:   WNL  ?Sensory/Perceptual disturbances:   WNL  ?Orientation: oriented to person, place, time/date, and situation  ?Attention: Good  ?Concentration: Good  ?Memory: WNL  ?Fund of knowledge:  Good  ?Insight:   Good  ?Judgment:  Good  ?Impulse Control: Good  ? ?Risk Assessment: ?Danger to Self:  No ?Self-injurious Behavior: No ?Danger to Others: No ?Duty to Warn:no ?Physical Aggression / Violence:No  ?Access to Firearms a concern: No  ?Gang Involvement:No  ? ?Subjective:  ? ?Linda Mcmillan participated in the office with therapist. Linda Mcmillan reviewed the events of the past week. Linda Mcmillan noted her son's recent move to [facility] and her home. She noted frequent negative self-talk including." "What if I can't?" In regards to helping her son get healthier. She endorsed anxiety, upset stomach, and increased worry. She noted feeling like a victim and She noted the mentality "why [place]?" And "What if I can't fix him?" She noted feeling "powerless" and "putting all this pressure on myself". She endorsed catastrofization regarding her worries.  We worked on processing her feelings during the session Linda Mcmillan coping.  Therapist provided psychoeducation regarding setting boundaries regarding worry and rumination and modeled this during the session.  We worked on setting time limits, employing positive self talk, and being mindful of reasonable timelines for change.  Therapist encouraged Linda Mcmillan to engage in self-care and to engage in building a social network outside of her  children.  Linda Mcmillan was engaged and motivated during the session and she expressed commitment towards her goals.  Therapist praised Linda Mcmillan for her effort and energy and provided supportive therapy. ? ? ?Interventions: CBT & Interpersonal ? ?Diagnosis:  ?Adjustment disorder with depressed mood ? ?Treatment Plan: ? ?Client Abilities/Strengths ?Linda Mcmillan is self-ware, motivated, and intelligent. ? ?Support System: ?Family ? ?Client Treatment Preferences ?Outpatient Therapy.  ? ?Client Statement of Needs ?Linda Mcmillan would like to become more social, develop a support system, adjust to her recent move, increase physical activity,  reduce rumination and catastrophization,  ?process past events (hospitalization), and bolster coping skills.  ? ?Treatment Level ?Weekly ? ?Symptoms ? ?Anxiety: difficulty managing worry and worrying too much about different things   (Status: maintained) ?Depression: Feeling down   (Status: maintained) ? ?Goals:  ?Linda Mcmillan experiences symptoms of depression and anxiety. ? ? ?Target Date: 04/01/22 Frequency: Weekly  ?Progress: 0 Modality: individual  ? ? ?Therapist will provide referrals for additional resources as appropriate.  ?Therapist will provide psycho-education regarding Shaely's diagnosis and corresponding treatment approaches and interventions. ?Licensed Clinical Social Worker, Linda Mcmillan, Linda Mcmillan will support the patient's ability to achieve the goals identified. will employ CBT, BA, Problem-solving, Solution Focused, Mindfulness,  coping skills, & other evidenced-based practices will be used to promote progress towards healthy functioning to help manage decrease symptoms associated with her diagnosis.  ? Reduce overall level, frequency, and intensity of the feelings of depression and anxiety evidenced by decreased overall symptoms from 6 to 7 days/week to 0 to 1 days/week per client report for at least 3 consecutive months. ?Verbally express understanding of the relationship between feelings  of  depression, anxiety and their impact on thinking patterns and behaviors. ?Verbalize an understanding of the role that distorted thinking plays in creating fears, excessive worry, and ruminations. ? ? ?(Linda Mcmillan participated in the creation of the treatment plan) ? ? ?Linda Mcmillan, Linda Mcmillan ?

## 2021-05-18 ENCOUNTER — Ambulatory Visit (INDEPENDENT_AMBULATORY_CARE_PROVIDER_SITE_OTHER): Payer: Medicare Other | Admitting: Psychology

## 2021-05-18 DIAGNOSIS — F4321 Adjustment disorder with depressed mood: Secondary | ICD-10-CM | POA: Diagnosis not present

## 2021-05-18 NOTE — Progress Notes (Signed)
[facility] Counselor/Therapist Progress Note ? ?Patient ID: Linda Mcmillan, MRN: [id number]   ? ?Date: 05/18/21 ? ?Time Spent: 2:07  pm - 3:12 pm : 65 Minutes ? ?Treatment Type: Individual Therapy. ? ?Reported Symptoms: sadness, anxiety, interpersonal stress.  ? ?Mental Status Exam: ?Appearance:  Neat and Well Groomed     ?Behavior: Appropriate  ?Motor: Normal  ?Speech/Language:  Clear and Coherent and Slow  ?Affect: Depressed and Tearful  ?Mood: sad  ?Thought process: normal  ?Thought content:   WNL  ?Sensory/Perceptual disturbances:   WNL  ?Orientation: oriented to person, place, time/date, and situation  ?Attention: Good  ?Concentration: Good  ?Memory: WNL  ?Fund of knowledge:  Good  ?Insight:   Good  ?Judgment:  Good  ?Impulse Control: Good  ? ?Risk Assessment: ?Danger to Self:  No ?Self-injurious Behavior: No ?Danger to Others: No ?Duty to Warn:no ?Physical Aggression / Violence:No  ?Access to Firearms a concern: No  ?Gang Involvement:No  ? ?Subjective:  ? ?Linda Mcmillan participated in the session, in person in the office with the therapist, and consented to treatment. Linda Mcmillan reviewed the events of the past week.  Linda Mcmillan discussed extreme interpersonal stress that resulted in disagreement with her son, [name], who recently moved from up [place].  Linda Mcmillan noted that [name] has moved to her house was largely uneventful but that disagreement occurred due to insurance issues and her attempts to aid in this process.  She noted that her history regarding difficulty receiving feedback from her and noted his negative extreme behavioral receiving any feedback constructive or otherwise.  She described her son as "tightly wound" and noted often becoming verbally demonstrative when he does not get his way or receiving feedback, as previously mentioned.  She noted wanting to move from that home, get her own apartment, and live there for 6 months.  We explored the dynamics during the session and what Linda Mcmillan described as  animosity towards her.  We worked on identifying her previous attempts to manage the stressors using assertiveness and boundary setting.  Linda Mcmillan discussed her worry regarding her son's reaction in regards to setting boundaries, which we explored.  Therapist modeled boundary setting and assertiveness during the session.  Linda Mcmillan endorsed gastrointestinal discomfort and pain due to her disagreement with her son.  Therapist validated and normalized Linda Mcmillan's feelings and experience encouraged Linda Mcmillan to begin identifying her boundaries going forward with her son.  Linda Mcmillan was tearful during the session but presented as motivated and hopeful.  Therapist praised Linda Mcmillan for her effort and energy and provided supportive therapy.  Therapist encouraged Linda Mcmillan to engage in relaxation and self-care. ? ?Interventions: Interpersonal ? ?Diagnosis:   ?Adjustment disorder with depressed mood ? ? ?Client Abilities/Strengths ?Linda Mcmillan is self-ware, motivated, and intelligent. ?  ?Support System: ?Family ?  ?Client Treatment Preferences ?Outpatient Therapy.  ?  ?Client Statement of Needs ?Linda Mcmillan would like to become more social, develop a support system, adjust to her recent move, increase physical activity,  reduce rumination and catastrophization,  ?process past events (hospitalization), and bolster coping skills.  ?  ?Treatment Level ?Weekly ?  ?Symptoms ?  ?Anxiety: difficulty managing worry and worrying too much about different things   (Status: maintained) ?Depression: Feeling down   (Status: maintained) ?  ?Goals:  ?Marlet experiences symptoms of depression and anxiety. ?  ?  ?Target Date: 04/01/22 Frequency: Weekly  ?Progress: 0 Modality: individual  ?  ?  ?Therapist will provide referrals for additional resources as appropriate.  ?Therapist will provide psycho-education regarding Subrina's diagnosis  and corresponding treatment approaches and interventions. ?Licensed Clinical Social Worker, [name], LCSW will support the  patient's ability to achieve the goals identified. will employ CBT, BA, Problem-solving, Solution Focused, Mindfulness,  coping skills, & other evidenced-based practices will be used to promote progress towards healthy functioning to help manage decrease symptoms associated with her diagnosis.  ? Reduce overall level, frequency, and intensity of the feelings of depression and anxiety evidenced by decreased overall symptoms from 6 to 7 days/week to 0 to 1 days/week per client report for at least 3 consecutive months. ?Verbally express understanding of the relationship between feelings of depression, anxiety and their impact on thinking patterns and behaviors. ?Verbalize an understanding of the role that distorted thinking plays in creating fears, excessive worry, and ruminations. ?  ?  ?([name] participated in the creation of the treatment plan) ? ? ? ?[name], LCSW ? ?   ?

## 2021-05-25 ENCOUNTER — Ambulatory Visit (INDEPENDENT_AMBULATORY_CARE_PROVIDER_SITE_OTHER): Payer: Medicare Other | Admitting: Psychology

## 2021-05-25 DIAGNOSIS — F4321 Adjustment disorder with depressed mood: Secondary | ICD-10-CM | POA: Diagnosis not present

## 2021-05-25 NOTE — Progress Notes (Addendum)
[facility] Counselor/Therapist Progress Note ? ?Patient ID: Linda Mcmillan, MRN: [id number]   ? ?Date: 05/25/21 ? ?Time Spent: 2:04  pm - 2:55 pm : 51 Minutes ? ?Treatment Type: Individual Therapy. ? ?Reported Symptoms: sadness, anxiety, interpersonal stress.  ? ?Mental Status Exam: ?Appearance:  NA     ?Behavior: Appropriate  ?Motor: N/a  ?Speech/Language:  Clear and Coherent and Slow  ?Affect: Depressed and Tearful  ?Mood: sad  ?Thought process: normal  ?Thought content:   WNL  ?Sensory/Perceptual disturbances:   WNL  ?Orientation: oriented to person, place, time/date, and situation  ?Attention: Good  ?Concentration: Good  ?Memory: WNL  ?Fund of knowledge:  Good  ?Insight:   Good  ?Judgment:  Good  ?Impulse Control: Good  ? ?Risk Assessment: ?Danger to Self:  No ?Self-injurious Behavior: No ?Danger to Others: No ?Duty to Warn:no ?Physical Aggression / Violence:No  ?Access to Firearms a concern: No  ?Gang Involvement:No  ? ?Subjective:  ? ?[name] participated in the session, via phone, from home and consented to treatment. Therapist participated from home office. Session was held online due to [place] pandemic. Jaquesha reviewed the events of the past week.Takeyla noted continued strain with her son, [name], who struggles to follow-up on health related concerns, work related concerns, and financial concerns.  She noted [name]'s recent move to her house, albeit temporarily, from up [place].  She noted this initially going well but devolving pretty quickly.  She noted the effect on her mood and overall outlook as a result.  She noted her frustration and sadness regarding her son's lack of acceptance of advice and lack of traction on health related concerns, respectively.  We continued to explore boundaries going forward including and specifically financial boundaries due to [place] alluding to her possibly cosigning for her home.  Desha discussed this not being a viable option endorsed significant anxiety  regarding her son's reaction to this said future boundary.  We explored this going forward and processed ways to manage this and communicate positively and assertively.  Therapist modeled this during the session.  Dalene was engaged and motivated during the session and expressed commitment towards her goals.  Therapist praised Didi for her effort and energy and will follow up with her next week.  She continues to benefit from counseling. ? ?Interventions: Interpersonal ? ?Diagnosis:   ?Adjustment disorder with depressed mood ? ? ?Client Abilities/Strengths ?Alyxis is self-ware, motivated, and intelligent. ?  ?Support System: ?Family ?  ?Client Treatment Preferences ?Outpatient Therapy.  ?  ?Client Statement of Needs ?Toi would like to become more social, develop a support system, adjust to her recent move, increase physical activity,  reduce rumination and catastrophization,  ?process past events (hospitalization), and bolster coping skills.  ?  ?Treatment Level ?Weekly ?  ?Symptoms ?  ?Anxiety: difficulty managing worry and worrying too much about different things   (Status: maintained) ?Depression: Feeling down   (Status: maintained) ?  ?Goals:  ?Kemora experiences symptoms of depression and anxiety. ?  ?  ?Target Date: 04/01/22 Frequency: Weekly  ?Progress: 0 Modality: individual  ?  ?  ?Therapist will provide referrals for additional resources as appropriate.  ?Therapist will provide psycho-education regarding Tangi's diagnosis and corresponding treatment approaches and interventions. ?Licensed Clinical Social Worker, [name], LCSW will support the patient's ability to achieve the goals identified. will employ CBT, BA, Problem-solving, Solution Focused, Mindfulness,  coping skills, & other evidenced-based practices will be used to promote progress towards healthy functioning to help manage decrease symptoms  associated with her diagnosis.  ? Reduce overall level, frequency, and intensity of the  feelings of depression and anxiety evidenced by decreased overall symptoms from 6 to 7 days/week to 0 to 1 days/week per client report for at least 3 consecutive months. ?Verbally express understanding of the relationship between feelings of depression, anxiety and their impact on thinking patterns and behaviors. ?Verbalize an understanding of the role that distorted thinking plays in creating fears, excessive worry, and ruminations. ?  ?  ?([name] participated in the creation of the treatment plan) ? ? ? ?[name], LCSW ? ?   ?

## 2021-05-27 ENCOUNTER — Ambulatory Visit
Admission: RE | Admit: 2021-05-27 | Discharge: 2021-05-27 | Disposition: A | Discharge status: Home / Self Care | Payer: Medicare Other | Source: Ambulatory Visit | Attending: Family | Admitting: Family

## 2021-05-27 DIAGNOSIS — Z780 Asymptomatic menopausal state: Secondary | ICD-10-CM

## 2021-05-27 DIAGNOSIS — Z1231 Encounter for screening mammogram for malignant neoplasm of breast: Secondary | ICD-10-CM

## 2021-05-28 ENCOUNTER — Encounter: Payer: Self-pay | Admitting: Family

## 2021-06-01 ENCOUNTER — Ambulatory Visit (INDEPENDENT_AMBULATORY_CARE_PROVIDER_SITE_OTHER): Payer: Medicare Other | Admitting: Psychology

## 2021-06-01 ENCOUNTER — Other Ambulatory Visit: Payer: Self-pay

## 2021-06-01 DIAGNOSIS — F4321 Adjustment disorder with depressed mood: Secondary | ICD-10-CM

## 2021-06-01 NOTE — Progress Notes (Signed)
[facility] Counselor/Therapist Progress Note ? ?Patient ID: Aunika Kirsten, MRN: [id number]   ? ?Date: 06/01/21 ? ?Time Spent: 2:01  pm - 2:37pm : 36 Minutes ? ?Treatment Type: Individual Therapy. ? ?Reported Symptoms: sadness, anxiety, interpersonal stress.  ? ?Mental Status Exam: ?Appearance:  NA     ?Behavior: Appropriate  ?Motor: N/a  ?Speech/Language:  Clear and Coherent and Slow  ?Affect: Depressed and Tearful  ?Mood: sad  ?Thought process: normal  ?Thought content:   WNL  ?Sensory/Perceptual disturbances:   WNL  ?Orientation: oriented to person, place, time/date, and situation  ?Attention: Good  ?Concentration: Good  ?Memory: WNL  ?Fund of knowledge:  Good  ?Insight:   Good  ?Judgment:  Good  ?Impulse Control: Good  ? ?Risk Assessment: ?Danger to Self:  No ?Self-injurious Behavior: No ?Danger to Others: No ?Duty to Warn:no ?Physical Aggression / Violence:No  ?Access to Firearms a concern: No  ?Gang Involvement:No  ? ?Subjective:  ? ?[name] participated in the session, via phone, from home and consented to treatment. Therapist participated from home office. Session was held online due to [place] pandemic. Analynn reviewed the events of the past week. She discussed her son's recent loss of insurance and her frustration related to. We continued to explore her frustration regarding her son's lack of drive to address his health and mental health. She is working on developing a consistent exercise routine & continued disconnection with her family. Therapist discussed the importance of setting boundaries for self and others. Specifically in regards to self and rumination. Therapist reviewed numerous management skills related to including setting time limits, use of health redirection, and being mindful of negative self-talk. Therapist modeled this during the session.  Lorelle was engaged and motivated during the session and expressed commitment towards her goals.  Therapist praised Jayliah for her effort  and energy and will follow up with her next week.  She continues to benefit from counseling. ? ?Interventions: Cognitive Behavioral Therapy and Interpersonal ? ?Diagnosis:   ?Adjustment disorder with depressed mood ? ? ?Client Abilities/Strengths ?Ashe is self-ware, motivated, and intelligent. ?  ?Support System: ?Family ?  ?Client Treatment Preferences ?Outpatient Therapy.  ?  ?Client Statement of Needs ?Anagha would like to become more social, develop a support system, adjust to her recent move, increase physical activity,  reduce rumination and catastrophization,  ?process past events (hospitalization), and bolster coping skills.  ?  ?Treatment Level ?Weekly ?  ?Symptoms ?  ?Anxiety: difficulty managing worry and worrying too much about different things   (Status: maintained) ?Depression: Feeling down   (Status: maintained) ?  ?Goals:  ?Karey experiences symptoms of depression and anxiety. ?  ?  ?Target Date: 04/01/22 Frequency: Weekly  ?Progress: 0 Modality: individual  ?  ?  ?Therapist will provide referrals for additional resources as appropriate.  ?Therapist will provide psycho-education regarding Atalaya's diagnosis and corresponding treatment approaches and interventions. ?Licensed Clinical Social Worker, [name], LCSW will support the patient's ability to achieve the goals identified. will employ CBT, BA, Problem-solving, Solution Focused, Mindfulness,  coping skills, & other evidenced-based practices will be used to promote progress towards healthy functioning to help manage decrease symptoms associated with her diagnosis.  ? Reduce overall level, frequency, and intensity of the feelings of depression and anxiety evidenced by decreased overall symptoms from 6 to 7 days/week to 0 to 1 days/week per client report for at least 3 consecutive months. ?Verbally express understanding of the relationship between feelings of depression, anxiety and their impact on  thinking patterns and behaviors. ?Verbalize  an understanding of the role that distorted thinking plays in creating fears, excessive worry, and ruminations. ?  ?  ?([name] participated in the creation of the treatment plan) ? ? ? ?[name], LCSW ? ?   ?

## 2021-06-02 ENCOUNTER — Other Ambulatory Visit: Payer: Self-pay

## 2021-06-08 ENCOUNTER — Ambulatory Visit (INDEPENDENT_AMBULATORY_CARE_PROVIDER_SITE_OTHER): Payer: Medicare Other | Admitting: Psychology

## 2021-06-08 DIAGNOSIS — F4321 Adjustment disorder with depressed mood: Secondary | ICD-10-CM

## 2021-06-08 NOTE — Progress Notes (Signed)
[facility] Counselor/Therapist Progress Note ? ?Patient ID: Linda Mcmillan, MRN: [id number]   ? ?Date: 06/08/21 ? ?Time Spent: 1:59 pm - 3:04 pm : 65 Minutes ? ?Treatment Type: Individual Therapy. ? ?Reported Symptoms: sadness, anxiety, interpersonal stress.  ? ?Mental Status Exam: ?Appearance:  Neat and Well Groomed     ?Behavior: Appropriate  ?Motor: N/a  ?Speech/Language:  Clear and Coherent  ?Affect: Congruent  ?Mood: sad  ?Thought process: normal  ?Thought content:   WNL  ?Sensory/Perceptual disturbances:   WNL  ?Orientation: oriented to person, place, time/date, and situation  ?Attention: Good  ?Concentration: Good  ?Memory: WNL  ?Fund of knowledge:  Good  ?Insight:   Good  ?Judgment:  Good  ?Impulse Control: Good  ? ?Risk Assessment: ?Danger to Self:  No ?Self-injurious Behavior: No ?Danger to Others: No ?Duty to Warn:no ?Physical Aggression / Violence:No  ?Access to Firearms a concern: No  ?Gang Involvement:No  ? ?Subjective:  ? ?Linda Mcmillan participated in the session, via phone, from home and consented to treatment. Therapist participated from home office. Session was held online due to [place] pandemic. Linda Mcmillan reviewed the events of the past week. She noted feeling disrespected by her children as she attempts to provide some support. We explored this during the session and her attempts to address these concerns via boundaries.We worked on feeling identification during the session. We worked on identifying additional boundaries going forward. We worked on identifying ways to disengage from disagreements. Therapist modeled this during the session. Linda Mcmillan was engaged and motivated during the session and expressed commitment towards her goals.  Therapist praised Linda Mcmillan for her effort and energy and will follow up with her next week.  She continues to benefit from counseling. ? ?Interventions: Interpersonal ? ?Diagnosis:   ?Adjustment disorder with depressed mood ? ? ?Client  Abilities/Strengths ?Linda Mcmillan is self-ware, motivated, and intelligent. ?  ?Support System: ?Family ?  ?Client Treatment Preferences ?Outpatient Therapy.  ?  ?Client Statement of Needs ?Linda Mcmillan would like to become more social, develop a support system, adjust to her recent move, increase physical activity,  reduce rumination and catastrophization,  ?process past events (hospitalization), and bolster coping skills.  ?  ?Treatment Level ?Weekly ?  ?Symptoms ?  ?Anxiety: difficulty managing worry and worrying too much about different things   (Status: maintained) ?Depression: Feeling down   (Status: maintained) ?  ?Goals:  ?Linda Mcmillan experiences symptoms of depression and anxiety. ?  ?  ?Target Date: 04/01/22 Frequency: Weekly  ?Progress: 0 Modality: individual  ?  ?  ?Therapist will provide referrals for additional resources as appropriate.  ?Therapist will provide psycho-education regarding Linda Mcmillan's diagnosis and corresponding treatment approaches and interventions. ?Licensed Clinical Social Worker, [name], LCSW will support the patient's ability to achieve the goals identified. will employ CBT, BA, Problem-solving, Solution Focused, Mindfulness,  coping skills, & other evidenced-based practices will be used to promote progress towards healthy functioning to help manage decrease symptoms associated with her diagnosis.  ? Reduce overall level, frequency, and intensity of the feelings of depression and anxiety evidenced by decreased overall symptoms from 6 to 7 days/week to 0 to 1 days/week per client report for at least 3 consecutive months. ?Verbally express understanding of the relationship between feelings of depression, anxiety and their impact on thinking patterns and behaviors. ?Verbalize an understanding of the role that distorted thinking plays in creating fears, excessive worry, and ruminations. ?  ?  ?(Linda Mcmillan participated in the creation of the treatment plan) ? ? ? ?[name],  LCSW ? ?   ?

## 2021-06-15 ENCOUNTER — Ambulatory Visit (INDEPENDENT_AMBULATORY_CARE_PROVIDER_SITE_OTHER): Payer: Medicare Other | Admitting: Psychology

## 2021-06-15 DIAGNOSIS — F4321 Adjustment disorder with depressed mood: Secondary | ICD-10-CM | POA: Diagnosis not present

## 2021-06-15 NOTE — Progress Notes (Signed)
[facility] Counselor/Therapist Progress Note ? ?Patient ID: Linda Mcmillan, MRN: [id number]   ? ?Date: 06/15/21 ? ?Time Spent: 2:03 pm - 3:00 pm : 57 Minutes ? ?Treatment Type: Individual Therapy. ? ?Reported Symptoms: sadness, anxiety, interpersonal stress.  ? ?Mental Status Exam: ?Appearance:  Neat and Well Groomed     ?Behavior: Appropriate  ?Motor: Normal  ?Speech/Language:  Clear and Coherent  ?Affect: Congruent  ?Mood: sad  ?Thought process: normal  ?Thought content:   WNL  ?Sensory/Perceptual disturbances:   WNL  ?Orientation: oriented to person, place, time/date, and situation  ?Attention: Good  ?Concentration: Good  ?Memory: WNL  ?Fund of knowledge:  Good  ?Insight:   Good  ?Judgment:  Good  ?Impulse Control: Good  ? ?Risk Assessment: ?Danger to Self:  No ?Self-injurious Behavior: No ?Danger to Others: No ?Duty to Warn:no ?Physical Aggression / Violence:No  ?Access to Firearms a concern: No  ?Gang Involvement:No  ? ?Subjective:  ? ?Linda Mcmillan participated in the session, via phone, from home and consented to treatment. Therapist participated from home office. Session was held online due to [place] pandemic. Linda Mcmillan reviewed the events of the past week. She noted continued struggles interacting with her son who lives in the home with her. She noted feelings of frustration regarding her son's inaction regarding his own health and wellbeing. She noted feelings of powerlessness in this area. We began processing this during the session and the effect of this on her mood. Therapist encouraged Linda Mcmillan to identify areas of healthy control and to focus in this area including maintaining boundaries. We discussed enacting this in session going forward and setting boundaries regarding discussions centered around her son's behavior and actions and focusing on areas of control and self-care. Linda Mcmillan was agreeable and motivated towards this goal. Linda Mcmillan was engaged and motivated during the session and expressed  commitment towards her goals.  Therapist praised Linda Mcmillan for her effort and energy and will follow up with her next week.  She continues to benefit from counseling. ? ?Interventions: Interpersonal ? ?Diagnosis:   ?Adjustment disorder with depressed mood ? ? ?Client Abilities/Strengths ?Linda Mcmillan is self-ware, motivated, and intelligent. ?  ?Support System: ?Family ?  ?Client Treatment Preferences ?Outpatient Therapy.  ?  ?Client Statement of Needs ?Linda Mcmillan would like to become more social, develop a support system, adjust to her recent move, increase physical activity,  reduce rumination and catastrophization,  ?process past events (hospitalization), and bolster coping skills.  ?  ?Treatment Level ?Weekly ?  ?Symptoms ?  ?Anxiety: difficulty managing worry and worrying too much about different things   (Status: maintained) ?Depression: Feeling down   (Status: maintained) ?  ?Goals:  ?Linda Mcmillan experiences symptoms of depression and anxiety. ?  ?  ?Target Date: 04/01/22 Frequency: Weekly  ?Progress: 0 Modality: individual  ?  ?  ?Therapist will provide referrals for additional resources as appropriate.  ?Therapist will provide psycho-education regarding Linda Mcmillan's diagnosis and corresponding treatment approaches and interventions. ?Licensed Clinical Social Worker, Linda Irish, LCSW will support the patient's ability to achieve the goals identified. will employ CBT, BA, Problem-solving, Solution Focused, Mindfulness,  coping skills, & other evidenced-based practices will be used to promote progress towards healthy functioning to help manage decrease symptoms associated with her diagnosis.  ? Reduce overall level, frequency, and intensity of the feelings of depression and anxiety evidenced by decreased overall symptoms from 6 to 7 days/week to 0 to 1 days/week per client report for at least 3 consecutive months. ?Verbally express understanding of the relationship  between feelings of depression, anxiety and their impact on  thinking patterns and behaviors. ?Verbalize an understanding of the role that distorted thinking plays in creating fears, excessive worry, and ruminations. ?  ?  ?([name] participated in the creation of the treatment plan) ? ? ? ?Linda Irish, LCSW ? ?   ?

## 2021-06-22 ENCOUNTER — Ambulatory Visit (INDEPENDENT_AMBULATORY_CARE_PROVIDER_SITE_OTHER): Payer: Medicare Other | Admitting: Psychology

## 2021-06-22 DIAGNOSIS — F4321 Adjustment disorder with depressed mood: Secondary | ICD-10-CM

## 2021-06-22 NOTE — Progress Notes (Signed)
[facility] Counselor/Therapist Progress Note ? ?Patient ID: Linda Mcmillan, MRN: [id number]   ? ?Date: 06/22/21 ? ?Time Spent: 2:01 pm - 2:50 pm : 49 Minutes ? ?Treatment Type: Individual Therapy. ? ?Reported Symptoms: sadness, anxiety, interpersonal stress.  ? ?Mental Status Exam: ?Appearance:  Neat and Well Groomed     ?Behavior: Appropriate  ?Motor: Normal  ?Speech/Language:  Clear and Coherent  ?Affect: Congruent  ?Mood: sad  ?Thought process: normal  ?Thought content:   WNL  ?Sensory/Perceptual disturbances:   WNL  ?Orientation: oriented to person, place, time/date, and situation  ?Attention: Good  ?Concentration: Good  ?Memory: WNL  ?Fund of knowledge:  Good  ?Insight:   Good  ?Judgment:  Good  ?Impulse Control: Good  ? ?Risk Assessment: ?Danger to Self:  No ?Self-injurious Behavior: No ?Danger to Others: No ?Duty to Warn:no ?Physical Aggression / Violence:No  ?Access to Firearms a concern: No  ?Gang Involvement:No  ? ?Subjective:  ? ?[name] participated in the session, via phone, from home and consented to treatment. Therapist participated from home office. Session was held online due to [place] pandemic. Ellieana reviewed the events of the past week. Karys noted working on managing her frustration regarding her son's behaviors and decision-making. We processed this and noted her feelings regarding her son's behavior and general expression. She noted her emphasis to engage in socially and to spend more time focused on her own needs.  We worked on identifying activities to engage in that align with her goals of becoming more social. Therapist encouraged Henessy to work on compiling a list of activities she enjoys and activities that she might enjoy. Kendrea was engaged and motivated during the session and expressed commitment towards her goals. We discussed the possibility of creating a schedule of self-care and engagement over the next few weeks. Therapist praised Oakley for her effort and energy  and will follow up with her next week.  She continues to benefit from counseling. ? ?Interventions: Interpersonal and Behavioral Activation ? ?Diagnosis:   ?Adjustment disorder with depressed mood ? ? ?Client Abilities/Strengths ?Colleena is self-ware, motivated, and intelligent. ?  ?Support System: ?Family ?  ?Client Treatment Preferences ?Outpatient Therapy.  ?  ?Client Statement of Needs ?Tyianna would like to become more social, develop a support system, adjust to her recent move, increase physical activity,  reduce rumination and catastrophization,  ?process past events (hospitalization), and bolster coping skills.  ?  ?Treatment Level ?Weekly ?  ?Symptoms ?  ?Anxiety: difficulty managing worry and worrying too much about different things   (Status: maintained) ?Depression: Feeling down   (Status: maintained) ?  ?Goals:  ?Berry experiences symptoms of depression and anxiety. ?  ?  ?Target Date: 04/01/22 Frequency: Weekly  ?Progress: 0 Modality: individual  ?  ?  ?Therapist will provide referrals for additional resources as appropriate.  ?Therapist will provide psycho-education regarding Delcenia's diagnosis and corresponding treatment approaches and interventions. ?Licensed Clinical Social Worker, [name], LCSW will support the patient's ability to achieve the goals identified. will employ CBT, BA, Problem-solving, Solution Focused, Mindfulness,  coping skills, & other evidenced-based practices will be used to promote progress towards healthy functioning to help manage decrease symptoms associated with her diagnosis.  ? Reduce overall level, frequency, and intensity of the feelings of depression and anxiety evidenced by decreased overall symptoms from 6 to 7 days/week to 0 to 1 days/week per client report for at least 3 consecutive months. ?Verbally express understanding of the relationship between feelings of depression, anxiety and  their impact on thinking patterns and behaviors. ?Verbalize an understanding  of the role that distorted thinking plays in creating fears, excessive worry, and ruminations. ?  ?  ?([name] participated in the creation of the treatment plan) ? ? ? ?[name], LCSW ?

## 2021-06-29 ENCOUNTER — Ambulatory Visit (INDEPENDENT_AMBULATORY_CARE_PROVIDER_SITE_OTHER): Payer: Medicare Other | Admitting: Psychology

## 2021-06-29 DIAGNOSIS — F4321 Adjustment disorder with depressed mood: Secondary | ICD-10-CM | POA: Diagnosis not present

## 2021-06-29 NOTE — Progress Notes (Signed)
[facility] Counselor/Therapist Progress Note ? ?Patient ID: Linda Mcmillan, MRN: [id number]   ? ?Date: 06/29/21 ? ?Time Spent: 2:03 pm - 2:54  pm : 51 Minutes ? ?Treatment Type: Individual Therapy. ? ?Reported Symptoms: sadness, anxiety, interpersonal stress.  ? ?Mental Status Exam: ?Appearance:  Neat and Well Groomed     ?Behavior: Appropriate  ?Motor: Normal  ?Speech/Language:  Clear and Coherent  ?Affect: Congruent  ?Mood: anxious and sad  ?Thought process: normal  ?Thought content:   WNL  ?Sensory/Perceptual disturbances:   WNL  ?Orientation: oriented to person, place, time/date, and situation  ?Attention: Good  ?Concentration: Good  ?Memory: WNL  ?Fund of knowledge:  Good  ?Insight:   Good  ?Judgment:  Good  ?Impulse Control: Good  ? ?Risk Assessment: ?Danger to Self:  No ?Self-injurious Behavior: No ?Danger to Others: No ?Duty to Warn:no ?Physical Aggression / Violence:No  ?Access to Firearms a concern: No  ?Gang Involvement:No  ? ?Subjective:  ? ?Linda Mcmillan participated in the session, via phone, from home and consented to treatment. Therapist participated from home office. Session was held online due to [place] pandemic. Linda Mcmillan reviewed the events of the past week. Linda Mcmillan noted her son's intent to move out of the home once the purchase of his new home. We reviewed boundaries, going forward, in regards to her son.  She noted being overwhelmed by her own health needs and needing to address these concerns, which are a barrier to engagement. She noted worry regarding a specific issue with her eyes that needs to be medically addressed. She noted her worry regarding her friend, Jeanell Sparrow, whose health is declining and currently receiving hospice care. She noted her worry and anxiety about her son's health appointment, on the 24th, due to the possibility of not being able ot be helped or [name] not engaging with his care. We worked on exploring this, during the session, her boundaries should [name] not engage in  his own care. Therapist encouraged Zarayah to engage in introspective regarding what it would be like to disconnect from being involved in his care.Therapist validated Linda Mcmillan's feelings and experience. Therapist provided supportive therapy.  She continues to benefit from counseling. ? ?Interventions: Interpersonal ? ?Diagnosis:   ?Adjustment disorder with depressed mood ? ? ?Client Abilities/Strengths ?Linda Mcmillan is self-ware, motivated, and intelligent. ?  ?Support System: ?Family ?  ?Client Treatment Preferences ?Outpatient Therapy.  ?  ?Client Statement of Needs ?Linda Mcmillan would like to become more social, develop a support system, adjust to her recent move, increase physical activity,  reduce rumination and catastrophization,  ?process past events (hospitalization), and bolster coping skills.  ?  ?Treatment Level ?Weekly ?  ?Symptoms ?  ?Anxiety: difficulty managing worry and worrying too much about different things   (Status: declined) ?Depression: Feeling down   (Status: maintained) ?  ?Goals:  ?Tifanny experiences symptoms of depression and anxiety. ?  ?  ?Target Date: 04/01/22 Frequency: Weekly  ?Progress: 0 Modality: individual  ?  ?  ?Therapist will provide referrals for additional resources as appropriate.  ?Therapist will provide psycho-education regarding Sascha's diagnosis and corresponding treatment approaches and interventions. ?Licensed Clinical Social Worker, [name], LCSW will support the patient's ability to achieve the goals identified. will employ CBT, BA, Problem-solving, Solution Focused, Mindfulness,  coping skills, & other evidenced-based practices will be used to promote progress towards healthy functioning to help manage decrease symptoms associated with her diagnosis.  ? Reduce overall level, frequency, and intensity of the feelings of depression and anxiety evidenced by  decreased overall symptoms from 6 to 7 days/week to 0 to 1 days/week per client report for at least 3 consecutive  months. ?Verbally express understanding of the relationship between feelings of depression, anxiety and their impact on thinking patterns and behaviors. ?Verbalize an understanding of the role that distorted thinking plays in creating fears, excessive worry, and ruminations. ?  ?  ?([name] participated in the creation of the treatment plan) ? ? ? ?[name], LCSW ?

## 2021-07-06 ENCOUNTER — Ambulatory Visit: Payer: Medicare Other | Admitting: Psychology

## 2021-07-08 ENCOUNTER — Ambulatory Visit (INDEPENDENT_AMBULATORY_CARE_PROVIDER_SITE_OTHER): Payer: Medicare Other | Admitting: Family Medicine

## 2021-07-08 ENCOUNTER — Telehealth: Payer: Self-pay | Admitting: Family

## 2021-07-08 ENCOUNTER — Encounter: Payer: Self-pay | Admitting: Family Medicine

## 2021-07-08 VITALS — BP 98/60 | HR 72 | Temp 98.2°F | Ht 61.75 in | Wt 117.0 lb

## 2021-07-08 DIAGNOSIS — R300 Dysuria: Secondary | ICD-10-CM | POA: Diagnosis not present

## 2021-07-08 LAB — POCT URINALYSIS DIPSTICK
Bilirubin, UA: NEGATIVE
Blood, UA: POSITIVE
Glucose, UA: NEGATIVE
Nitrite, UA: NEGATIVE
Protein, UA: POSITIVE — AB
Spec Grav, UA: 1.015 (ref 1.010–1.025)
Urobilinogen, UA: 0.2 E.U./dL
pH, UA: 6.5 (ref 5.0–8.0)

## 2021-07-08 MED ORDER — CIPROFLOXACIN HCL 500 MG PO TABS: 500.0000 mg | ORAL_TABLET | Freq: Two times a day (BID) | ORAL | 0 refills | Status: DC

## 2021-07-08 NOTE — Patient Instructions (Signed)
It was very nice to see you today!  Hold rosuvastatin while on cipro Start estrace cream next week.    PLEASE NOTE:  If you had any lab tests please let us know if you have not heard back within a few days. You may see your results on MyChart before we have a chance to review them but we will give you a call once they are reviewed by [place]. If we ordered any referrals today, please let us know if you have not heard from their office within the next week.   Please try these tips to maintain a healthy lifestyle:  Eat most of your calories during the day when you are active. Eliminate processed foods including packaged sweets (pies, cakes, cookies), reduce intake of potatoes, white bread, white pasta, and white rice. Look for whole grain options, oat flour or almond flour.  Each meal should contain half fruits/vegetables, one quarter protein, and one quarter carbs (no bigger than a computer mouse).  Cut down on sweet beverages. This includes juice, soda, and sweet tea. Also watch fruit intake, though this is a healthier sweet option, it still contains natural sugar! Limit to 3 servings daily.  Drink at least 1 glass of water with each meal and aim for at least 8 glasses per day  Exercise at least 150 minutes every week.

## 2021-07-08 NOTE — Telephone Encounter (Signed)
Patient would like to TOC from [facility] to [facility].- loves them both but would like to stay with [facility]-

## 2021-07-08 NOTE — Telephone Encounter (Signed)
Please advise 

## 2021-07-08 NOTE — Progress Notes (Addendum)
Subjective:     Patient ID: Linda Mcmillan, female    DOB: Sep 04, 1950, 71 y.o.   MRN: [id number]  Chief Complaint  Patient presents with   Dysuria    Started this morning    Urinary Urgency    Feels like she need to go, but have not do much Recurrent UTI's     HPI-moved from [place] last yr Dysuria this am-intense.  2 nights ago-freq.  Urgency to go but then nothing- no back pain.  Some lower abd.  No f/c/c/d Gets UTI freq-sept,Jan UTI 06/23/20-e coli macrobid 09/23/20 -ecoli,keb,proteus-keflex 10/26/20-pseudomonas  bbactrim-rx, then keflex, then cipro-helped From May till sept-"never felt totally ok"-intermitt symptoms.   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 - will see GI Dr. Lyndel Safe soon.   Does have vag dryness.  Not SA.  Hasn't started estrogen cream yet.   Health Maintenance Due  Topic Date Due   Hepatitis C Screening  Never done   COLONOSCOPY (Pts 45-15yr Insurance coverage will need to be confirmed)  Never done   DEXA SCAN  Never done    Past Medical History:  Diagnosis Date   Anxiety 2018   Arthritis Neck, back (herniated lumbar disc   Atherosclerosis    Cataract Surgery, 2020   Depression 2016   Hyperlipidemia 10 years   [place] Restless leg syndrome, [place]    Past Surgical History:  Procedure Laterality Date   ABDOMINAL HYSTERECTOMY  20 years ago    Outpatient Medications Prior to Visit  Medication Sig Dispense Refill   clonazePAM (KLONOPIN) 0.5 MG tablet Take 1 tablet (0.5 mg total) by mouth at bedtime. 30 tablet 5   estradiol (ESTRACE) 0.1 MG/GM vaginal cream 0.5 g intravaginally 3 times per week. 42.5 g 0   gabapentin (NEURONTIN) 600 MG tablet Take 1 tablet at 6p and 8p. 180 tablet 3   levothyroxine (SYNTHROID) 75 MCG tablet Synthroid 75 mcg tablet 90 tablet 1   rOPINIRole (REQUIP) 1 MG tablet Take 1 tablet at 6p 90 tablet 3    rosuvastatin (CRESTOR) 10 MG tablet Take 1 tablet by mouth daily.     No facility-administered medications prior to visit.    Allergies  Allergen Reactions   Sulfa Antibiotics Nausea Only and Rash    Other reaction(s): Headache   ROS neg/noncontributory except as noted HPI/below Dry eyes-going to [facility] for eyes. -burned eyes.  Seeing eye surg 5/31.      Objective:     BP 98/60   Pulse 72   Temp 98.2 F (36.8 C) (Temporal)   Ht 5' 1.75" (1.568 m)   Wt 117 lb (53.1 kg)   SpO2 98%   BMI 21.57 kg/m  Wt Readings from Last 3 Encounters:  07/08/21 117 lb (53.1 kg)  04/13/21 120 lb 12.8 oz (54.8 kg)  03/12/21 118 lb (53.5 kg)    Physical Exam   Gen: WDWN NAD wf HEENT: NCAT, conjunctiva not injected, sclera nonicteric NECK:  supple, no thyromegaly, no nodes, no carotid bruits CARDIAC: RRR, S1S2+, no murmur.  LUNGS: CTAB. No wheezes ABDOMEN:  BS+, soft, mildly tender suprapubically. No HSM, no masses. No CVAT EXT:  no edema MSK: no gross abnormalities.  NEURO: A&O x3.  CN II-XII intact.  PSYCH: normal mood. Good eye contact  Jan UA neg Reviewed pt records of uti meds and some cx-spent 34m w/pt reviewing, educating,  devising plan     Results for orders placed or performed in visit on 07/08/21  POCT urinalysis dipstick  Result Value Ref Range   Color, UA yellow    Clarity, UA cloudy    Glucose, UA Negative Negative   Bilirubin, UA neg    Ketones, UA postitve    Spec Grav, UA 1.015 1.010 - 1.025   Blood, UA positive    pH, UA 6.5 5.0 - 8.0   Protein, UA Positive (A) Negative   Urobilinogen, UA 0.2 0.2 or 1.0 E.U./dL   Nitrite, UA neg    Leukocytes, UA Moderate (2+) (A) Negative   Appearance     Odor      Assessment & Plan:   Problem List Items Addressed This Visit   None Visit Diagnoses     Dysuria    -  Primary   Relevant Orders   POCT urinalysis dipstick      Dysuria-has had a lot of problems lately.  May partly be d/t vag dryness, etc.   Will tx w/cipro-hold statin.  Check UA and cx.  Start estrace cream for vaginal/blader health.  Consider urologist if continues.  Answered questions  No orders of the defined types were placed in this encounter.   Wellington Hampshire, MD

## 2021-07-08 NOTE — Telephone Encounter (Signed)
fine by me

## 2021-07-08 NOTE — Telephone Encounter (Signed)
Pcp changed to dr Cherlynn Kaiser- vml for pt to be aware

## 2021-07-09 ENCOUNTER — Telehealth: Payer: Self-pay | Admitting: Family Medicine

## 2021-07-09 ENCOUNTER — Other Ambulatory Visit: Payer: Self-pay | Admitting: *Deleted

## 2021-07-09 LAB — URINE CULTURE
MICRO NUMBER:: 13445135
SPECIMEN QUALITY:: ADEQUATE

## 2021-07-09 MED ORDER — CEPHALEXIN 500 MG PO CAPS
500.0000 mg | ORAL_CAPSULE | Freq: Two times a day (BID) | ORAL | 0 refills | Status: AC
Start: 2021-07-09 — End: 2021-07-16

## 2021-07-09 NOTE — Telephone Encounter (Signed)
Patient notified and verbalized understanding. Rx sent to the pharmacy.

## 2021-07-09 NOTE — Telephone Encounter (Signed)
FYI Patient will be called when results come back.

## 2021-07-09 NOTE — Telephone Encounter (Signed)
Spoke to patient,- patient feels fine - patient does not need to come in per patient - patient would like a call with urinalysis test -    Patient Name: Linda Mcmillan Gender: Female DOB: 01-05-1951 Age: 71 Y 56 M 7 D Return Phone Number: [phone] (Primary) Address: City/ State/ Zip: [place]  [address] Client [place] [facility] [place] Client Site [place] [facility] [place] Contact Type Call Who Is Calling Patient / Member / Family / Caregiver Call Type Triage / Clinical Relationship To Patient Self Return Phone Number [phone] (Primary) Chief Complaint Medication reaction Reason for Call Symptomatic / Request for [place] states they took their medication and now their cheeks are very red. Dr. Cherlynn Kaiser is not listed. Translation No Nurse Assessment Nurse: Anabel Bene, RN, Anderson [place] Date/Time Eilene Ghazi Time): 07/08/2021 8:29:44 PM Confirm and document reason for call. If symptomatic, describe symptoms. ---Caller states she had UTI symptoms and was seen in the office today - prescribed Cipro. Has taken it before with no issues (has sulfa issues). Took her first dose at 52 and second at 2 - now she has no discomfort but noticed her pinks are very red. Redness is flat and is currently spreading to her neck. Does the patient have any new or worsening symptoms? ---Yes Will a triage be completed? ---Yes Related visit to physician within the last 2 weeks? ---Yes Does the PT have any chronic conditions? (i.e. diabetes, asthma, this includes High risk factors for pregnancy, etc.) ---No Is this a behavioral health or substance abuse call? ---No Guidelines Guideline Title Affirmed Question Affirmed Notes Nurse Date/Time (Eastern Time) Rash - Widespread On Drugs [1] Taking new prescription medication AND [2] rash within 4 hours of 1st dose Cher Nakai 07/08/2021 8:35:01 PM Disp.  Time Eilene Ghazi Time) Disposition Final User PLEASE NOTE: All timestamps contained within this report are represented as [place] Standard Time. CONFIDENTIALTY NOTICE: This fax transmission is intended only for the addressee. It contains information that is legally privileged, confidential or otherwise protected from use or disclosure. If you are not the intended recipient, you are strictly prohibited from reviewing, disclosing, copying using or disseminating any of this information or taking any action in reliance on or regarding this information. If you have received this fax in error, please notify us immediately by telephone so that we can arrange for its return to [place]. Phone: [phone], Toll-Free: [phone], Fax: [phone] Page: 2 of 2 Call Id: [id number] 07/08/2021 8:40:32 PM See HCP within 4 Hours (or PCP triage) Yes Anabel Bene, RN, Nell Range Disagree/Comply Comply Caller Understands Yes PreDisposition InappropriateToAsk Care Advice Given Per Guideline SEE HCP (OR PCP TRIAGE) WITHIN 4 HOURS: STOP THE MEDICATION: * Stop the medicine until examined. ANTIHISTAMINE FOR SEVERE ITCHING: * Use diphenhydramine (over-the-counter Benadryl 50 mg PO) or any antihistamine medicine that you might have. CALL BACK IF: * You become worse CARE ADVICE given per Rash - Widespread on Drugs (Adult) guideline * IF OFFICE WILL BE CLOSED AND NO PCP (PRIMARY CARE PROVIDER) SECOND-LEVEL TRIAGE: You need to be seen within the next 3 or 4 hours. A nearby Urgent Care Center [facility]) is often a good source of care. Another choice is to go to the ED. Go sooner if you become worse. Comments User: Rosanna Randy, RN Date/Time Eilene Ghazi Time): 07/08/2021 8:34:01 PM RLS, hypothyroidism, statin User: Rosanna Randy, RN Date/Time Eilene Ghazi Time): 07/08/2021 8:41:10 PM Pt sounds undecided if she will be going to an  UC tonight. Referrals GO TO FACILITY UNDECIDED

## 2021-07-09 NOTE — Telephone Encounter (Signed)
Patient stated that her cheeks were really red and neck had some redness, not really a rash, after second dose of cipro. Patient stated that she took some antihistamine, wanted to know if she should keep taking Cipro. Patient notified that culture results are not back yet and will be notified when they are.

## 2021-07-10 NOTE — Addendum Note (Signed)
Addended by: Wellington Hampshire on: 07/10/2021 04:16 PM   Modules accepted: Orders

## 2021-07-11 ENCOUNTER — Encounter: Payer: Self-pay | Admitting: Family Medicine

## 2021-07-13 ENCOUNTER — Ambulatory Visit (INDEPENDENT_AMBULATORY_CARE_PROVIDER_SITE_OTHER): Payer: Medicare Other | Admitting: Psychology

## 2021-07-13 ENCOUNTER — Other Ambulatory Visit (INDEPENDENT_AMBULATORY_CARE_PROVIDER_SITE_OTHER): Payer: Medicare Other

## 2021-07-13 DIAGNOSIS — R300 Dysuria: Secondary | ICD-10-CM | POA: Diagnosis not present

## 2021-07-13 DIAGNOSIS — F4321 Adjustment disorder with depressed mood: Secondary | ICD-10-CM

## 2021-07-13 LAB — URINALYSIS, ROUTINE W REFLEX MICROSCOPIC
Bilirubin Urine: NEGATIVE
Hgb urine dipstick: NEGATIVE
Ketones, ur: NEGATIVE
Leukocytes,Ua: NEGATIVE
Nitrite: NEGATIVE
RBC / HPF: NONE SEEN (ref 0–?)
Specific Gravity, Urine: 1.01 (ref 1.000–1.030)
Total Protein, Urine: NEGATIVE
Urine Glucose: NEGATIVE
Urobilinogen, UA: 0.2 (ref 0.0–1.0)
pH: 7 (ref 5.0–8.0)

## 2021-07-13 NOTE — Progress Notes (Signed)
[place] Counselor/Therapist Progress Note  Patient ID: Linda Mcmillan, MRN: [id number]    Date: 07/13/21  Time Spent: 2:02 pm - 2:53  pm : 51  Minutes  Treatment Type: Individual Therapy.  Reported Symptoms: sadness, anxiety, interpersonal stress.   Mental Status Exam: Appearance:  Neat and Well Groomed     Behavior: Appropriate  Motor: Normal  Speech/Language:  Clear and Coherent  Affect: Congruent  Mood: anxious and sad  Thought process: normal  Thought content:   WNL  Sensory/Perceptual disturbances:   WNL  Orientation: oriented to person, place, time/date, and situation  Attention: Good  Concentration: Good  Memory: WNL  Fund of knowledge:  Good  Insight:   Good  Judgment:  Good  Impulse Control: Good   Risk Assessment: Danger to Self:  No Self-injurious Behavior: No Danger to Others: No Duty to Warn:no Physical Aggression / Violence:No  Access to Firearms a concern: No  Gang Involvement:No   Subjective:   Linda Mcmillan participated in the session, via phone, from home and consented to treatment. Therapist participated from home office. Session was held online due to [place] pandemic. [name] reviewed the events of the past week. She noted her son's recent medical appointment and not receiving any conclusive evidence for Linda Mcmillan's pain. She noted her worry that her son was dying for the past 5 years but noted more accepting that Linda Mcmillan's health and treatment which is in his control. She noted her own worry regarding her recent labs and is currently awaiting results. She noted wanting to be "peaceful and happy" including frequent travel  but noted needing a partner. She noted an interest in companionship. She noted the recent and unexpected loss of her friend, Linda Mcmillan. She noted frustration and hurt regarding not being able to visit due to his wife, Linda Mcmillan's lack of planning for a trip. We began to explored this during the session and the effect on her mood. Therapist  validated Linda Mcmillan's feelings and experience. Therapist provided supportive therapy.  She continues to benefit from counseling.  Interventions: Grief Therapy and Interpersonal  Diagnosis:   Adjustment disorder with depressed mood   Client Abilities/Strengths Linda Mcmillan is self-ware, motivated, and intelligent.   Support System: Family   Client Treatment Preferences Outpatient Therapy.    Client Statement of Needs Linda Mcmillan would like to become more social, develop a support system, adjust to her recent move, increase physical activity,  reduce rumination and catastrophization,  process past events (hospitalization), and bolster coping skills.    Treatment Level Weekly   Symptoms   Anxiety: difficulty managing worry and worrying too much about different things   (Status: declined) Depression: Feeling down   (Status: maintained)   Goals:  Linda Mcmillan experiences symptoms of depression and anxiety.     Target Date: 04/01/22 Frequency: Weekly  Progress: 0 Modality: individual      Therapist will provide referrals for additional resources as appropriate.  Therapist will provide psycho-education regarding Linda Mcmillan's diagnosis and corresponding treatment approaches and interventions. Licensed Clinical Social Worker, [place], LCSW will support the patient's ability to achieve the goals identified. will employ CBT, BA, Problem-solving, Solution Focused, Mindfulness,  coping skills, & other evidenced-based practices will be used to promote progress towards healthy functioning to help manage decrease symptoms associated with her diagnosis.   Reduce overall level, frequency, and intensity of the feelings of depression and anxiety evidenced by decreased overall symptoms from 6 to 7 days/week to 0 to 1 days/week per client report for at least 3 consecutive  months. Verbally express understanding of the relationship between feelings of depression, anxiety and their impact on thinking patterns and  behaviors. Verbalize an understanding of the role that distorted thinking plays in creating fears, excessive worry, and ruminations.     Linda Mcmillan participated in the creation of the treatment plan)    Linda Irish, LCSW

## 2021-07-20 ENCOUNTER — Ambulatory Visit (INDEPENDENT_AMBULATORY_CARE_PROVIDER_SITE_OTHER): Payer: Medicare Other | Admitting: Psychology

## 2021-07-20 DIAGNOSIS — F4321 Adjustment disorder with depressed mood: Secondary | ICD-10-CM

## 2021-07-20 NOTE — Progress Notes (Signed)
[place] Counselor/Therapist Progress Note  Patient ID: Linda Mcmillan, MRN: [id number]    Date: 07/20/21  Time Spent: 2:02 pm - 2:59  pm : 57  Minutes  Treatment Type: Individual Therapy.  Reported Symptoms: sadness, anxiety, interpersonal stress.   Mental Status Exam: Appearance:  Neat and Well Groomed     Behavior: Appropriate  Motor: Normal  Speech/Language:  Clear and Coherent  Affect: Congruent  Mood: anxious and sad  Thought process: normal  Thought content:   WNL  Sensory/Perceptual disturbances:   WNL  Orientation: oriented to person, place, time/date, and situation  Attention: Good  Concentration: Good  Memory: WNL  Fund of knowledge:  Good  Insight:   Good  Judgment:  Good  Impulse Control: Good   Risk Assessment: Danger to Self:  No Self-injurious Behavior: No Danger to Others: No Duty to Warn:no Physical Aggression / Violence:No  Access to Firearms a concern: No  Gang Involvement:No   Subjective:   Linda Mcmillan participated in the session, via phone, from home and consented to treatment. Therapist participated from home office. Session was held online due to [place] pandemic. Linda Mcmillan reviewed the events of the past week. Linda Mcmillan noted a need to focus on herself since her son moved out to a home nearby. She noted feeling like a "holding pattern" in regards to life decisions. We explored this during the session and discussed ways to set goals during this time until she is able to move. She noted her health being a barrier and noted specific health issues being a determining factor for her move. She noted the local lack of support affecting her mood and ability to feel rooted. She noted feelings of loneliness despie her attempts to form relationships, friendships and otherwise. We explored ways to become more social going forwards. Linda Mcmillan was engaged and motivated during the session and expressed commitment towards our goals. Therapist praised Linda Mcmillan and  provided supportive therapy.   Interventions: Interpersonal  Diagnosis:   Adjustment disorder with depressed mood   Client Abilities/Strengths Linda Mcmillan is self-ware, motivated, and intelligent.   Support System: Family   Client Treatment Preferences Outpatient Therapy.    Client Statement of Needs Linda Mcmillan would like to become more social, develop a support system, adjust to her recent move, increase physical activity,  reduce rumination and catastrophization,  process past events (hospitalization), and bolster coping skills.    Treatment Level Weekly   Symptoms   Anxiety: difficulty managing worry and worrying too much about different things   (Status: declined) Depression: Feeling down   (Status: maintained)   Goals:  Linda Mcmillan experiences symptoms of depression and anxiety.     Target Date: 04/01/22 Frequency: Weekly  Progress: 0 Modality: individual      Therapist will provide referrals for additional resources as appropriate.  Therapist will provide psycho-education regarding Linda Mcmillan's diagnosis and corresponding treatment approaches and interventions. Licensed Clinical Social Worker, [place], LCSW will support the patient's ability to achieve the goals identified. will employ CBT, BA, Problem-solving, Solution Focused, Mindfulness,  coping skills, & other evidenced-based practices will be used to promote progress towards healthy functioning to help manage decrease symptoms associated with her diagnosis.   Reduce overall level, frequency, and intensity of the feelings of depression and anxiety evidenced by decreased overall symptoms from 6 to 7 days/week to 0 to 1 days/week per client report for at least 3 consecutive months. Verbally express understanding of the relationship between feelings of depression, anxiety and their impact on thinking patterns and  behaviors. Verbalize an understanding of the role that distorted thinking plays in creating fears, excessive worry,  and ruminations.     Linda Mcmillan participated in the creation of the treatment plan)    [name], LCSW

## 2021-07-27 ENCOUNTER — Ambulatory Visit: Payer: Medicare Other | Admitting: Psychology

## 2021-07-30 ENCOUNTER — Ambulatory Visit (INDEPENDENT_AMBULATORY_CARE_PROVIDER_SITE_OTHER): Payer: Medicare Other | Admitting: Gastroenterology

## 2021-07-30 ENCOUNTER — Encounter: Payer: Self-pay | Admitting: Gastroenterology

## 2021-07-30 ENCOUNTER — Other Ambulatory Visit (INDEPENDENT_AMBULATORY_CARE_PROVIDER_SITE_OTHER): Payer: Medicare Other

## 2021-07-30 VITALS — BP 122/68 | HR 82 | Ht 61.75 in | Wt 116.5 lb

## 2021-07-30 DIAGNOSIS — K760 Fatty (change of) liver, not elsewhere classified: Secondary | ICD-10-CM | POA: Diagnosis not present

## 2021-07-30 DIAGNOSIS — Z800 Family history of malignant neoplasm of digestive organs: Secondary | ICD-10-CM

## 2021-07-30 DIAGNOSIS — K8689 Other specified diseases of pancreas: Secondary | ICD-10-CM | POA: Diagnosis not present

## 2021-07-30 DIAGNOSIS — Z86010 Personal history of colonic polyps: Secondary | ICD-10-CM

## 2021-07-30 LAB — CBC WITH DIFFERENTIAL/PLATELET
Basophils Absolute: 0 10*3/uL (ref 0.0–0.1)
Basophils Relative: 1 % (ref 0.0–3.0)
Eosinophils Absolute: 0.2 10*3/uL (ref 0.0–0.7)
Eosinophils Relative: 5.3 % — ABNORMAL HIGH (ref 0.0–5.0)
HCT: 40.6 % (ref 36.0–46.0)
Hemoglobin: 13.4 g/dL (ref 12.0–15.0)
Lymphocytes Relative: 40.7 % (ref 12.0–46.0)
Lymphs Abs: 1.7 10*3/uL (ref 0.7–4.0)
MCHC: 33.1 g/dL (ref 30.0–36.0)
MCV: 93.1 fl (ref 78.0–100.0)
Monocytes Absolute: 0.5 10*3/uL (ref 0.1–1.0)
Monocytes Relative: 12.7 % — ABNORMAL HIGH (ref 3.0–12.0)
Neutro Abs: 1.7 10*3/uL (ref 1.4–7.7)
Neutrophils Relative %: 40.3 % — ABNORMAL LOW (ref 43.0–77.0)
Platelets: 244 10*3/uL (ref 150.0–400.0)
RBC: 4.36 Mil/uL (ref 3.87–5.11)
RDW: 13.8 % (ref 11.5–15.5)
WBC: 4.1 10*3/uL (ref 4.0–10.5)

## 2021-07-30 LAB — COMPREHENSIVE METABOLIC PANEL
ALT: 35 U/L (ref 0–35)
AST: 31 U/L (ref 0–37)
Albumin: 4.4 g/dL (ref 3.5–5.2)
Alkaline Phosphatase: 48 U/L (ref 39–117)
BUN: 15 mg/dL (ref 6–23)
CO2: 34 mEq/L — ABNORMAL HIGH (ref 19–32)
Calcium: 10.1 mg/dL (ref 8.4–10.5)
Chloride: 100 mEq/L (ref 96–112)
Creatinine, Ser: 0.67 mg/dL (ref 0.40–1.20)
GFR: 86.87 mL/min (ref >60.00)
Glucose, Bld: 90 mg/dL (ref 70–99)
Potassium: 4.4 mEq/L (ref 3.5–5.1)
Sodium: 140 mEq/L (ref 135–145)
Total Bilirubin: 0.4 mg/dL (ref 0.2–1.2)
Total Protein: 7.3 g/dL (ref 6.0–8.3)

## 2021-07-30 LAB — LIPASE: Lipase: 105 U/L — ABNORMAL HIGH (ref 11.0–59.0)

## 2021-07-30 MED ORDER — SUTAB 1479-225-188 MG PO TABS: 1.0000 | ORAL_TABLET | ORAL | 0 refills | Status: DC

## 2021-07-30 NOTE — Progress Notes (Addendum)
Chief Complaint: To get established  Referring Provider:  Dulce Sellar, NP      ASSESSMENT AND PLAN;   #1. 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 No ampullary masses. GI @[facility]  recommended repeat EUS in 1 year to make sure there are no small masses missed.  #2. H/O polyps/FH CRC (mom at age 73)  #3. Fatty liver with Nl LFTs.  Plan: -EUS (will run it by Dr Mansouraty/Dr Christella Hartigan) -CBC, CMP, lipase, CA19-9, CEA -Colon with sutab.   Discussed risks & benefits of colonoscopy. Risks including rare perforation req laparotomy, bleeding after bx/polypectomy req blood transfusion, rarely missing neoplasms, risks of anesthesia/sedation, rare risk of damage to internal organs. Benefits outweigh the risks. Patient agrees to proceed. All the questions were answered. Pt consents to proceed.  Staff message sent to Dr. Tina Griffiths  Addendum I have discussed with Dr. Christella Hartigan (via staff messaging) He recommends MRCP over EUS at this time Would proceed with the same. RG  HPI:    Linda Mcmillan is a 71 y.o. female  Moved from [place] to [place]  Had CT AP May 2022 @[facility]  showing dilated PD (8 mm) and CBD(9 mm) with subtle hypoattenuating focus at the ampulla ?Etiology. 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  No ampullary mass.  However, she was advised to get repeat EUS in 1 yr to ensure stability and r/o any lesions.  No nausea, vomiting, heartburn, regurgitation, odynophagia or dysphagia.  No significant diarrhea or constipation.  No melena or hematochezia. No unintentional weight loss. No abdominal pain.  No FH panc ca or previous history of pancreatitis. No ETOH.  FH: Mom had colon ca at age 57 (now 94-doing good)   Past GI work-up: EGD/EUS 07/09/2020 ([facility])-report sent for scanning  -No significant pathology in the pancreatic head, genu, body, tail and uncinate process.   -PD dilatation is likely an anatomic variant in  setting of an ansa loop -PD 6 mm in the head of the pancreas, 4 mm genu and 2 mm in the body of pancreas.   -Nl GB, ampulla,PV -Rpt EUS in 1 yr.  CT AP with contrast 06/15/2020 -Mildly dilated CBD (9 mm), PD (8 mm) with subtle hypoattenuating focus at the ampulla-negative present normal duodenal mucosa or underlying mass.  Correlate with lab values permitting obstruction.  Recommend GI consultation. -Mildly distended stomach.  US/USE 10/05/2020 -Fatty liver -Normal gallbladder. CBD 5.7 mm -Visualized portions of pancreas/normal. -Metavir score F1 consistent with mild fibrosis  Pt had 3 colonoscopies (first 1 had 1 polyp, repeat in 3 years.  Then 2 polyps , repeat in 5 years , then most recently October 2018-1 polyp , recommended to repeat in 5 years). Colon 11/2016- polyp  Past Medical History:  Diagnosis Date   Anxiety 2018   Arthritis Neck, back (herniated lumbar disc   Atherosclerosis    Cataract Surgery, 2020   Depression 2016   Hyperlipidemia 10 years   Neuromuscular disorder (HCC) Restless leg syndrome, 10 years    Past Surgical History:  Procedure Laterality Date   ABDOMINAL HYSTERECTOMY  20 years ago   cataract surgery  2020   EYE SURGERY Bilateral     Family History  Problem Relation Age of Onset   Arthritis Mother    Cancer Mother        Colon Cancer at 74 years old   Diabetes Mother    Varicose Veins Mother    Colon cancer Mother  Alcohol abuse Father    Other Father        Heavy Smoker   Varicose Veins Maternal Grandmother    Other Son        PTSD from [place] War   ADD / ADHD Son    Alcohol abuse Son    Depression Son    Depression Son    Breast cancer Paternal Aunt    Rectal cancer Neg Hx    Stomach cancer Neg Hx    Pancreatic cancer Neg Hx    Esophageal cancer Neg Hx     Social History   Tobacco Use   Smoking status: Never   Smokeless tobacco: Never  Vaping Use   Vaping Use: Never used  Substance Use Topics   Alcohol use: Never   Drug  use: Never    Current Outpatient Medications  Medication Sig Dispense Refill   clonazePAM (KLONOPIN) 0.5 MG tablet Take 1 tablet (0.5 mg total) by mouth at bedtime. 30 tablet 5   doxycycline (MONODOX) 50 MG capsule Take by mouth.     estradiol (ESTRACE) 0.1 MG/GM vaginal cream 0.5 g intravaginally 3 times per week. 42.5 g 0   gabapentin (NEURONTIN) 600 MG tablet Take 1 tablet at 6p and 8p. 180 tablet 3   levothyroxine (SYNTHROID) 75 MCG tablet Synthroid 75 mcg tablet 90 tablet 1   rOPINIRole (REQUIP) 1 MG tablet Take 1 tablet at 6p 90 tablet 3   rosuvastatin (CRESTOR) 10 MG tablet Take 1 tablet by mouth daily.     No current facility-administered medications for this visit.    Allergies  Allergen Reactions   Sulfa Antibiotics Nausea Only and Rash    Other reaction(s): Headache    Review of Systems:  Constitutional: Denies fever, chills, diaphoresis, appetite change and fatigue.  HEENT: Denies photophobia, eye pain, redness, hearing loss, ear pain, congestion, sore throat, rhinorrhea, sneezing, mouth sores, neck pain, neck stiffness and tinnitus.   Respiratory: Denies SOB, DOE, cough, chest tightness,  and wheezing.   Cardiovascular: Denies chest pain, palpitations and leg swelling.  Genitourinary: Denies dysuria, urgency, frequency, hematuria, flank pain and difficulty urinating.  Musculoskeletal: Denies myalgias, back pain, joint swelling, arthralgias and gait problem.  Skin: No rash.  Neurological: Denies dizziness, seizures, syncope, weakness, light-headedness, numbness and headaches.  Hematological: Denies adenopathy. Easy bruising, personal or family bleeding history  Psychiatric/Behavioral: Has anxiety, no depression     Physical Exam:    BP 122/68   Pulse 82   Ht 5' 1.75" (1.568 m)   Wt 116 lb 8 oz (52.8 kg)   SpO2 96%   BMI 21.48 kg/m  Wt Readings from Last 3 Encounters:  07/30/21 116 lb 8 oz (52.8 kg)  07/08/21 117 lb (53.1 kg)  04/13/21 120 lb 12.8 oz (54.8  kg)   Constitutional:  Well-developed, in no acute distress. Psychiatric: Normal mood and affect. Behavior is normal. HEENT: Pupils normal.  Conjunctivae are normal. No scleral icterus. Cardiovascular: Normal rate, regular rhythm. No edema Pulmonary/chest: Effort normal and breath sounds normal. No wheezing, rales or rhonchi. Abdominal: Soft, nondistended. Nontender. Bowel sounds active throughout. There are no masses palpable. No hepatomegaly. Rectal: Deferred Neurological: Alert and oriented to person place and time. Skin: Skin is warm and dry. No rashes noted.  Data Reviewed: I have personally reviewed following labs and imaging studies  CBC:     No data to display          CMP:    Latest Ref Rng &  Units 03/09/2021   11:20 AM  CMP  Glucose 70 - 99 mg/dL 96   BUN 6 - 23 mg/dL 15   Creatinine 9.52 - 1.20 mg/dL 8.41   Sodium 324 - 401 mEq/L 139   Potassium 3.5 - 5.1 mEq/L 5.1   Chloride 96 - 112 mEq/L 99   CO2 19 - 32 mEq/L 34   Calcium 8.4 - 10.5 mg/dL 9.9   Total Protein 6.0 - 8.3 g/dL 7.2   Total Bilirubin 0.2 - 1.2 mg/dL 0.4   Alkaline Phos 39 - 117 U/L 52   AST 0 - 37 U/L 25   ALT 0 - 35 U/L 27       Edman Circle, MD 07/30/2021, 9:14 AM  Cc: Dulce Sellar, NP

## 2021-07-30 NOTE — Patient Instructions (Addendum)
If you are age 73 or older, your body mass index should be between 23-30. Your Body mass index is 21.48 kg/m. If this is out of the aforementioned range listed, please consider follow up with your Primary Care Provider.  If you are age 29 or younger, your body mass index should be between 19-25. Your Body mass index is 21.48 kg/m. If this is out of the aformentioned range listed, please consider follow up with your Primary Care Provider.   ________________________________________________________  The [facility] providers would like to encourage you to use [facility] to communicate with providers for non-urgent requests or questions.  Due to long hold times on the telephone, sending your provider a message by [facility] may be a faster and more efficient way to get a response.  Please allow 48 business hours for a response.  Please remember that this is for non-urgent requests.  _______________________________________________________  Your provider has requested that you go to the basement level for lab work before leaving today. Press "B" on the elevator. The lab is located at the first door on the left as you exit the elevator.  You have been scheduled for a colonoscopy. Please follow written instructions given to you at your visit today.  Please pick up your prep supplies at the pharmacy within the next 1-3 days. If you use inhalers (even only as needed), please bring them with you on the day of your procedure.  You have been given Miralax prep instructions per your request.  Please call in 3 weeks to the nurse regarding EUS.  Thank you,  Dr. Jackquline Denmark

## 2021-07-31 ENCOUNTER — Encounter: Payer: Self-pay | Admitting: Gastroenterology

## 2021-08-02 LAB — CEA: CEA: <2 ng/mL

## 2021-08-02 LAB — CANCER ANTIGEN 19-9: CA 19-9: 5 U/mL (ref <34)

## 2021-08-03 ENCOUNTER — Other Ambulatory Visit: Payer: Self-pay

## 2021-08-03 ENCOUNTER — Telehealth: Payer: Self-pay

## 2021-08-03 ENCOUNTER — Ambulatory Visit: Payer: Medicare Other | Admitting: Psychology

## 2021-08-03 DIAGNOSIS — K8689 Other specified diseases of pancreas: Secondary | ICD-10-CM

## 2021-08-03 NOTE — Telephone Encounter (Signed)
-----   Message from Jackquline Denmark, MD sent at 08/02/2021  3:41 PM EDT ----- Regarding: MRCP Hi Linda Mcmillan you had a good weekend Please set her up for MRCP and inform her. I have discussed with Dr. Reginal Lutes    ----- Message ----- From: Milus Banister, MD Sent: 08/02/2021   8:22 AM EDT To: Irving Copas., MD; Jackquline Denmark, MD Subject: RE: Pancreatic EUS                             [name], I think MRI with MRCP should answer the question as well as EUS. Can she have that instead?   ----- Message ----- From: Jackquline Denmark, MD Sent: 08/01/2021  11:46 AM EDT To: Milus Banister, MD; # Subject: Pancreatic EUS                                 71[place] [facility] [place]  had CT AP May 2022 '@[place]'$  showing double duct sign- dilated PD (8 mm) and CBD(9 mm) with subtle hypoattenuating focus at the ampulla. 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  No ampullary mass.  She was advised to get repeat EUS in 1 yr (just in case)  I have ordered CA 19-9 and [facility] Labs from yesterday did show mildly elevated lipase, Nl LFTs.   Thx as always for your input  RWashtayou had a good vacation Would love to see pics RG

## 2021-08-03 NOTE — Telephone Encounter (Signed)
Pt made aware of  Dr. Lyndel Safe recommendations to proceed with MRI/MRCP: Pt was ordered and scheduled for MRI/MRCP for 08/11/2021 at 1:00 PM at [facility]: Pt to arrive at 12:30: Nothing to eat or drink 4 hours prior. Pt made aware: Pt verbalized understanding with all questions answered.

## 2021-08-06 ENCOUNTER — Ambulatory Visit (INDEPENDENT_AMBULATORY_CARE_PROVIDER_SITE_OTHER): Payer: Medicare Other

## 2021-08-06 DIAGNOSIS — Z0000 Encounter for general adult medical examination without abnormal findings: Secondary | ICD-10-CM | POA: Diagnosis not present

## 2021-08-10 ENCOUNTER — Ambulatory Visit (INDEPENDENT_AMBULATORY_CARE_PROVIDER_SITE_OTHER): Payer: Medicare Other | Admitting: Psychology

## 2021-08-10 DIAGNOSIS — F4321 Adjustment disorder with depressed mood: Secondary | ICD-10-CM

## 2021-08-10 NOTE — Progress Notes (Signed)
[facility] Counselor/Therapist Progress Note  Patient ID: Linda Mcmillan, MRN: [id number]    Date: 08/10/21  Time Spent: 2:03 pm -  3:02  pm : 59 Minutes  Treatment Type: Individual Therapy.  Reported Symptoms: sadness, anxiety, interpersonal stress.   Mental Status Exam: Appearance:  Neat and Well Groomed     Behavior: Appropriate  Motor: Normal  Speech/Language:  Clear and Coherent  Affect: Congruent  Mood: anxious  Thought process: normal  Thought content:   WNL  Sensory/Perceptual disturbances:   WNL  Orientation: oriented to person, place, time/date, and situation  Attention: Good  Concentration: Good  Memory: WNL  Fund of knowledge:  Good  Insight:   Good  Judgment:  Good  Impulse Control: Good   Risk Assessment: Danger to Self:  No Self-injurious Behavior: No Danger to Others: No Duty to Warn:no Physical Aggression / Violence:No  Access to Firearms a concern: No  Gang Involvement:No   Subjective:   Linda Mcmillan participated in the session, via phone, from home and consented to treatment. Therapist participated from home office. Session was held online due to COVID pandemic. Linda Mcmillan reviewed the events of the past week. She noted her son moving out and things going well, in that regard. She noted feeling "better" and "more grounded". She noted difficulty deciding regarding her future living arrangement. She noted her attempts to reach out to friends from [place]. She notes feeling calmer due to this realization of wanting to go back home. Therapist encouraged Linda Mcmillan to engage in self-care and to socialize consistently. She noted some deficits in the area of socialization due to negative thoughts and feelings about the town she lives in. We discussed the benefits of adopting this negative perspective. Therapist provided supportive therapy.   Interventions: Interpersonal  Diagnosis:   Adjustment disorder with depressed mood  Client  Abilities/Strengths Linda Mcmillan is self-ware, motivated, and intelligent.   Support System: Family   Client Treatment Preferences Outpatient Therapy.    Client Statement of Needs Linda Mcmillan would like to become more social, develop a support system, adjust to her recent move, increase physical activity,  reduce rumination and catastrophization,  process past events (hospitalization), and bolster coping skills.    Treatment Level Weekly   Symptoms   Anxiety: difficulty managing worry and worrying too much about different things   (Status: declined) Depression: Feeling down   (Status: maintained)   Goals:  Linda Mcmillan experiences symptoms of depression and anxiety.     Target Date: 04/01/22 Frequency: Weekly  Progress: 0 Modality: individual      Therapist will provide referrals for additional resources as appropriate.  Therapist will provide psycho-education regarding Linda Mcmillan's diagnosis and corresponding treatment approaches and interventions. Licensed Clinical Social Worker, [place], LCSW will support the patient's ability to achieve the goals identified. will employ CBT, BA, Problem-solving, Solution Focused, Mindfulness,  coping skills, & other evidenced-based practices will be used to promote progress towards healthy functioning to help manage decrease symptoms associated with her diagnosis.   Reduce overall level, frequency, and intensity of the feelings of depression and anxiety evidenced by decreased overall symptoms from 6 to 7 days/week to 0 to 1 days/week per client report for at least 3 consecutive months. Verbally express understanding of the relationship between feelings of depression, anxiety and their impact on thinking patterns and behaviors. Verbalize an understanding of the role that distorted thinking plays in creating fears, excessive worry, and ruminations.     Linda Mcmillan participated in the creation of the treatment plan)  Delight Ovens, LCSW

## 2021-08-11 ENCOUNTER — Other Ambulatory Visit: Payer: Self-pay | Admitting: Gastroenterology

## 2021-08-11 ENCOUNTER — Ambulatory Visit (HOSPITAL_COMMUNITY)
Admission: RE | Admit: 2021-08-11 | Discharge: 2021-08-11 | Disposition: A | Discharge status: Home / Self Care | Payer: Medicare Other | Source: Ambulatory Visit | Attending: Gastroenterology | Admitting: Gastroenterology

## 2021-08-11 DIAGNOSIS — K8689 Other specified diseases of pancreas: Secondary | ICD-10-CM | POA: Insufficient documentation

## 2021-08-11 MED ORDER — GADOBUTROL 1 MMOL/ML IV SOLN
5.0000 mL | Freq: Once | INTRAVENOUS | Status: AC | PRN
  Administered 2021-08-11: 5 mL via INTRAVENOUS

## 2021-08-13 ENCOUNTER — Other Ambulatory Visit: Payer: Self-pay

## 2021-08-13 DIAGNOSIS — K8689 Other specified diseases of pancreas: Secondary | ICD-10-CM

## 2021-08-13 NOTE — Telephone Encounter (Signed)
Hi Linda, I got the results of my MRI and your comments. I was hoping to speak with someone, or at least have my following questions answered:   Is it prudent to have an annual MRI or EUS regarding the dilated pancreatic ducts?  No right answer.  Common bile duct is mildly dilated.  Pancreatic duct is actually normal on minimally prominent.  Plan for now is to repeat MRI/MRCP in 1 year.  If your liver tests are elevated, we would proceed with MRCP earlier followed by EUS.  Remo Lipps, please check LFTs amylase/lipase every 3 monthly x 1 year.  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 Better, worse, the same?  -We do not have the actual films to compare.  From the reports to look pretty much the same.   If I continue to have frequent UTIs, does this report indicate that I should follow up with a urologist? It is a good idea to follow-up with a urologist because these are 2 unrelated things -pancreas and UTIs   My recent bloodwork indicated an elevated lipase level seemingly based on a younger person, so is that a normal reading for someone my age?  There is a long range of normal.  You do not have any pain.  Hence, I do not think you have significant pancreatitis.  We will keep an eye on these blood tests.   RG  Remo Lipps, Pl see the plan above. RG

## 2021-08-17 ENCOUNTER — Ambulatory Visit: Payer: Medicare Other | Admitting: Psychology

## 2021-08-20 ENCOUNTER — Other Ambulatory Visit (HOSPITAL_COMMUNITY): Payer: Self-pay

## 2021-08-24 ENCOUNTER — Ambulatory Visit (INDEPENDENT_AMBULATORY_CARE_PROVIDER_SITE_OTHER): Payer: Medicare Other | Admitting: Psychology

## 2021-08-24 DIAGNOSIS — F4321 Adjustment disorder with depressed mood: Secondary | ICD-10-CM

## 2021-08-24 NOTE — Progress Notes (Signed)
[facility] Counselor/Therapist Progress Note  Patient ID: Linda Mcmillan, MRN: [id number]    Date: 08/24/21  Time Spent: 2:00 pm -  2:54  pm :  54 Minutes  Treatment Type: Individual Therapy.  Reported Symptoms: sadness, anxiety, interpersonal stress.   Mental Status Exam: Appearance:  Neat and Well Groomed     Behavior: Appropriate  Motor: Normal  Speech/Language:  Clear and Coherent  Affect: Congruent  Mood: anxious  Thought process: normal  Thought content:   WNL  Sensory/Perceptual disturbances:   WNL  Orientation: oriented to person, place, time/date, and situation  Attention: Good  Concentration: Good  Memory: WNL  Fund of knowledge:  Good  Insight:   Good  Judgment:  Good  Impulse Control: Good   Risk Assessment: Danger to Self:  No Self-injurious Behavior: No Danger to Others: No Duty to Warn:no Physical Aggression / Violence:No  Access to Firearms a concern: No  Gang Involvement:No   Subjective:   Linda Mcmillan participated in the session, via phone, from home and consented to treatment. Therapist participated from home office. Session was held online due to [name] pandemic. Linda Mcmillan reviewed the events of the past week. Linda Mcmillan noted her efforts to maintain her boundaries with her son, Linda Mcmillan, which therapist praised. She noted grieving for her sister's in regards to the relationship that could have been. We explored this, during the session, and began processing this during the session. She noted a need to eat more healthfully and increase her exercise. She noted stagnating too often and noted binging on tv shows and being on the couch "too long". We worked on identifying what her rhythm was like when her mood was better.We discussed ways to build a new routine that supports in the mood that she wants to have. Therapist praised Linda Mcmillan for her effort during the session and provided supportive therapy.   Interventions: Interpersonal  Diagnosis:   Adjustment  disorder with depressed mood  Client Abilities/Strengths Linda Mcmillan is self-ware, motivated, and intelligent.   Support System: Family   Client Treatment Preferences Outpatient Therapy.    Client Statement of Needs Linda Mcmillan would like to become more social, develop a support system, adjust to her recent move, increase physical activity,  reduce rumination and catastrophization,  process past events (hospitalization), and bolster coping skills.    Treatment Level Weekly   Symptoms   Anxiety: difficulty managing worry and worrying too much about different things   (Status: declined) Depression: Feeling down   (Status: maintained)   Goals:  Linda Mcmillan experiences symptoms of depression and anxiety.     Target Date: 04/01/22 Frequency: Weekly  Progress: 0 Modality: individual      Therapist will provide referrals for additional resources as appropriate.  Therapist will provide psycho-education regarding Linda Mcmillan's diagnosis and corresponding treatment approaches and interventions. Licensed Clinical Social Worker, [place], LCSW will support the patient's ability to achieve the goals identified. will employ CBT, BA, Problem-solving, Solution Focused, Mindfulness,  coping skills, & other evidenced-based practices will be used to promote progress towards healthy functioning to help manage decrease symptoms associated with her diagnosis.   Reduce overall level, frequency, and intensity of the feelings of depression and anxiety evidenced by decreased overall symptoms from 6 to 7 days/week to 0 to 1 days/week per client report for at least 3 consecutive months. Verbally express understanding of the relationship between feelings of depression, anxiety and their impact on thinking patterns and behaviors. Verbalize an understanding of the role that distorted thinking plays in creating  fears, excessive worry, and ruminations.     Linda Mcmillan participated in the creation of the treatment plan)   Linda Irish, LCSW

## 2021-08-30 ENCOUNTER — Encounter: Payer: Medicare Other | Admitting: Gastroenterology

## 2021-08-31 ENCOUNTER — Ambulatory Visit (INDEPENDENT_AMBULATORY_CARE_PROVIDER_SITE_OTHER): Payer: Medicare Other | Admitting: Psychology

## 2021-08-31 DIAGNOSIS — F4321 Adjustment disorder with depressed mood: Secondary | ICD-10-CM

## 2021-08-31 NOTE — Progress Notes (Signed)
[facility] Counselor/Therapist Progress Note  Patient ID: Linda Mcmillan, MRN: [id number]    Date: 08/31/21  Time Spent: 2:02 pm -  3:04  pm :  62 Minutes  Treatment Type: Individual Therapy.  Reported Symptoms: sadness, anxiety, interpersonal stress.   Mental Status Exam: Appearance:  Neat and Well Groomed     Behavior: Appropriate  Motor: Normal  Speech/Language:  Clear and Coherent  Affect: Congruent  Mood: anxious  Thought process: normal  Thought content:   WNL  Sensory/Perceptual disturbances:   WNL  Orientation: oriented to person, place, time/date, and situation  Attention: Good  Concentration: Good  Memory: WNL  Fund of knowledge:  Good  Insight:   Good  Judgment:  Good  Impulse Control: Good   Risk Assessment: Danger to Self:  No Self-injurious Behavior: No Danger to Others: No Duty to Warn:no Physical Aggression / Violence:No  Access to Firearms a concern: No  Gang Involvement:No   Subjective:   [name] participated in the session, via phone, from home and consented to treatment. Therapist participated from home office. Session was held online due to [place] pandemic. Amberia reviewed the events of the past week. She noted feelings of loneliness due to a lack of social support. She noted people "easily" walking away from her, even length friendships. She noted making "poor decisions"  and noted keeping "screwing up".  " I feel like now I'm damaged" in relation to being hospital. She noted this being shaming and embarrassing. We delineated many of her past friendships and the lack of their investment Micheline Maze, and [place]). We explored this relationship and her self-talk in relation to. We discussed her attribution of these losses to her decision-making and her own deficits. We began processing this during the session.We discussed her expectations of relationships, both friends and family, and the commonalities between the two. Therapist  praised Keishla for her effort during the session and provided supportive therapy.   Interventions: Cognitive Behavioral Therapy and Interpersonal  Diagnosis:   Adjustment disorder with depressed mood  Client Abilities/Strengths Tylor is self-ware, motivated, and intelligent.   Support System: Family   Client Treatment Preferences Outpatient Therapy.    Client Statement of Needs [place] would like to become more social, develop a support system, adjust to her recent move, increase physical activity,  reduce rumination and catastrophization,  process past events (hospitalization), and bolster coping skills.    Treatment Level Weekly   Symptoms   Anxiety: difficulty managing worry and worrying too much about different things   (Status: declined) Depression: Feeling down   (Status: maintained)   Goals:  Annalynne experiences symptoms of depression and anxiety.     Target Date: 04/01/22 Frequency: Weekly  Progress: 0 Modality: individual      Therapist will provide referrals for additional resources as appropriate.  Therapist will provide psycho-education regarding Mihira's diagnosis and corresponding treatment approaches and interventions. Licensed Clinical Social Worker, [place], LCSW will support the patient's ability to achieve the goals identified. will employ CBT, BA, Problem-solving, Solution Focused, Mindfulness,  coping skills, & other evidenced-based practices will be used to promote progress towards healthy functioning to help manage decrease symptoms associated with her diagnosis.   Reduce overall level, frequency, and intensity of the feelings of depression and anxiety evidenced by decreased overall symptoms from 6 to 7 days/week to 0 to 1 days/week per client report for at least 3 consecutive months. Verbally express understanding of the relationship between feelings of depression, anxiety and  their impact on thinking patterns and behaviors. Verbalize an  understanding of the role that distorted thinking plays in creating fears, excessive worry, and ruminations.     [name] participated in the creation of the treatment plan)   [name], LCSW

## 2021-09-06 ENCOUNTER — Other Ambulatory Visit: Payer: Self-pay | Admitting: Neurology

## 2021-09-07 ENCOUNTER — Ambulatory Visit (INDEPENDENT_AMBULATORY_CARE_PROVIDER_SITE_OTHER): Payer: Medicare Other | Admitting: Psychology

## 2021-09-07 DIAGNOSIS — F4321 Adjustment disorder with depressed mood: Secondary | ICD-10-CM | POA: Diagnosis not present

## 2021-09-07 NOTE — Progress Notes (Signed)
[facility] Counselor/Therapist Progress Note  Patient ID: Linda Mcmillan, MRN: [id number]    Date: 09/07/21  Time Spent: 2:00 pm -  3:00 pm :  60  Minutes  Treatment Type: Individual Therapy.  Reported Symptoms: sadness, anxiety, interpersonal stress.   Mental Status Exam: Appearance:  Neat and Well Groomed     Behavior: Appropriate  Motor: Normal  Speech/Language:  Clear and Coherent  Affect: Congruent  Mood: normal  Thought process: normal  Thought content:   WNL  Sensory/Perceptual disturbances:   WNL  Orientation: oriented to person, place, time/date, and situation  Attention: Good  Concentration: Good  Memory: WNL  Fund of knowledge:  Good  Insight:   Good  Judgment:  Good  Impulse Control: Good   Risk Assessment: Danger to Self:  No Self-injurious Behavior: No Danger to Others: No Duty to Warn:no Physical Aggression / Violence:No  Access to Firearms a concern: No  Gang Involvement:No   Subjective:   Linda Mcmillan participated in the session, via phone, from home and consented to treatment. Therapist participated from home office. Session was held online due to [place] pandemic. Linda Mcmillan reviewed the events of the past week. She noted continued thought about our previous session. We continued to process this during the session. She provided background regarding a breakdown with her friend, Linda Mcmillan. We worked on defining her own needs in a reciprocal relationships. She noted a need for connection, normalcy, commonalty. She noted a need for friendships to be equitable, supportive, down-to-earth, invested, a natural flow in dynamics, empathetic, social, gregarious, kind,  and outwardly focused. Warning signs in relationships include saying "hurtful" things, judgmental, self-focused, egotistical, extreme politics, callous/cold, selfish, disregarding, & no empathy.  She noted not having a "successful" romantic relationship. She provided feedback regarding her two  marriages. She noted her 1st husband being a drug dealer and noted that he was not involved with their son, Linda Mcmillan. She noted her second marriage, 8 years later, with Linda Mcmillan who she met at work. She noted him being married and attempting to date her, to which Linda Mcmillan refused until his divorce and sometime after. She noted feeling "old" at 23 eight and wanting along-term marriage. She dated Linda Mcmillan, who was Hispanic, who never fought for her with her parents. Linda Mcmillan noted the "love of her life" Linda Mcmillan and noted the circumstances being "off".  She noted not being able to have successful relations due to poor modeling by her parents. We discussed emotional reasoning and ways to challenge these distortions going forward. Linda Mcmillan was engaged and motivated, during the session, which therapist praised. Additionally, therapist praised Linda Mcmillan for her effort during the session and provided supportive therapy. We scheduled a follow-up for continued treatment.   Interventions: Cognitive Behavioral Therapy and Interpersonal  Diagnosis:   Adjustment disorder with depressed mood  Client Abilities/Strengths Linda Mcmillan is self-ware, motivated, and intelligent.   Support System: Family   Client Treatment Preferences Outpatient Therapy.    Client Statement of Needs Linda Mcmillan would like to become more social, develop a support system, adjust to her recent move, increase physical activity,  reduce rumination and catastrophization,  process past events (hospitalization), and bolster coping skills.    Treatment Level Weekly   Symptoms   Anxiety: difficulty managing worry and worrying too much about different things   (Status: declined) Depression: Feeling down   (Status: maintained)   Goals:  Linda Mcmillan experiences symptoms of depression and anxiety.     Target Date: 04/01/22 Frequency: Weekly  Progress: 0 Modality: individual  Therapist will provide referrals for additional resources as appropriate.  Therapist will  provide psycho-education regarding Linda Mcmillan's diagnosis and corresponding treatment approaches and interventions. Licensed Clinical Social Worker, [place], LCSW will support the patient's ability to achieve the goals identified. will employ CBT, BA, Problem-solving, Solution Focused, Mindfulness,  coping skills, & other evidenced-based practices will be used to promote progress towards healthy functioning to help manage decrease symptoms associated with her diagnosis.   Reduce overall level, frequency, and intensity of the feelings of depression and anxiety evidenced by decreased overall symptoms from 6 to 7 days/week to 0 to 1 days/week per client report for at least 3 consecutive months. Verbally express understanding of the relationship between feelings of depression, anxiety and their impact on thinking patterns and behaviors. Verbalize an understanding of the role that distorted thinking plays in creating fears, excessive worry, and ruminations.     Linda Mcmillan participated in the creation of the treatment plan)   Linda Irish, LCSW

## 2021-09-20 ENCOUNTER — Other Ambulatory Visit: Payer: Self-pay | Admitting: Family

## 2021-09-20 DIAGNOSIS — Z780 Asymptomatic menopausal state: Secondary | ICD-10-CM

## 2021-09-20 MED ORDER — ESTRADIOL 0.1 MG/GM VA CREA: TOPICAL_CREAM | VAGINAL | 0 refills | Status: DC

## 2021-09-23 ENCOUNTER — Encounter: Payer: Self-pay | Admitting: Certified Registered Nurse Anesthetist

## 2021-09-27 ENCOUNTER — Encounter: Payer: Self-pay | Admitting: Gastroenterology

## 2021-09-27 ENCOUNTER — Ambulatory Visit (AMBULATORY_SURGERY_CENTER): Payer: Medicare Other | Admitting: Gastroenterology

## 2021-09-27 VITALS — BP 93/53 | HR 79 | Temp 98.9°F | Resp 12 | Ht 61.0 in | Wt 116.0 lb

## 2021-09-27 DIAGNOSIS — D123 Benign neoplasm of transverse colon: Secondary | ICD-10-CM

## 2021-09-27 DIAGNOSIS — Z09 Encounter for follow-up examination after completed treatment for conditions other than malignant neoplasm: Secondary | ICD-10-CM | POA: Diagnosis not present

## 2021-09-27 DIAGNOSIS — Z800 Family history of malignant neoplasm of digestive organs: Secondary | ICD-10-CM

## 2021-09-27 DIAGNOSIS — Z86010 Personal history of colonic polyps: Secondary | ICD-10-CM

## 2021-09-27 HISTORY — PX: COLONOSCOPY: SHX5424

## 2021-09-27 MED ORDER — SODIUM CHLORIDE 0.9 % IV SOLN: 500.0000 mL | Freq: Once | INTRAVENOUS | Status: DC

## 2021-09-27 NOTE — Progress Notes (Signed)
[facility] History and Physical   Primary Care Physician:  Tawnya Crook, MD   Reason for Procedure:   H/O polyps/FH CRC (mom at age 73)  Plan:    Colon today     HPI: Linda Mcmillan is a 71 y.o. female  No nausea, vomiting, heartburn, regurgitation, odynophagia or dysphagia.  No significant diarrhea or constipation.  No melena or hematochezia. No unintentional weight loss. No abdominal pain.   Past Medical History:  Diagnosis Date   Anxiety 2018   Arthritis Neck, back (herniated lumbar disc   Atherosclerosis    Cataract Surgery, 2020   Depression 2016   Hyperlipidemia 10 years   Neuromuscular disorder ([place]) Restless leg syndrome, 10 years   Thyroid disease    hypothyroidism    Past Surgical History:  Procedure Laterality Date   ABDOMINAL HYSTERECTOMY  20 years ago   cataract surgery  2020   COLONOSCOPY  09/27/2021   [facility]   EUS  07/09/2020   [facility]   EYE SURGERY Bilateral     Prior to Admission medications   Medication Sig Start Date End Date Taking? Authorizing Provider  clonazePAM (KLONOPIN) 0.5 MG tablet TAKE ONE TABLET BY MOUTH EVERY NIGHT AT BEDTIME 09/06/21  Yes Cameron Sprang, MD  gabapentin (NEURONTIN) 600 MG tablet Take 1 tablet at 6p and 8p. 03/12/21  Yes Narda Amber K, DO  levothyroxine (SYNTHROID) 75 MCG tablet Synthroid 75 mcg tablet 04/19/21  Yes Hudnell, Colletta [place], NP  rOPINIRole (REQUIP) 1 MG tablet Take 1 tablet at 6p 03/12/21  Yes Patel, Donika K, DO  rosuvastatin (CRESTOR) 10 MG tablet Take 1 tablet by mouth daily. 03/16/20  Yes [provider]  estradiol (ESTRACE) 0.1 MG/GM vaginal cream 0.5 g intravaginally 3 times per week. 09/20/21   Tawnya Crook, MD    Current Outpatient Medications  Medication Sig Dispense Refill   clonazePAM (KLONOPIN) 0.5 MG tablet TAKE ONE TABLET BY MOUTH EVERY NIGHT AT BEDTIME 30 tablet 0   gabapentin (NEURONTIN) 600 MG tablet Take 1 tablet at 6p and 8p. 180 tablet 3    levothyroxine (SYNTHROID) 75 MCG tablet Synthroid 75 mcg tablet 90 tablet 1   rOPINIRole (REQUIP) 1 MG tablet Take 1 tablet at 6p 90 tablet 3   rosuvastatin (CRESTOR) 10 MG tablet Take 1 tablet by mouth daily.     estradiol (ESTRACE) 0.1 MG/GM vaginal cream 0.5 g intravaginally 3 times per week. 42.5 g 0   Current Facility-Administered Medications  Medication Dose Route Frequency Provider Last Rate Last Admin   0.9 %  sodium chloride infusion  500 mL Intravenous Once Jackquline Denmark, MD        Allergies as of 09/27/2021 - Review Complete 09/27/2021  Allergen Reaction Noted   Sulfa antibiotics Nausea Only and Rash 11/09/2020    Family History  Problem Relation Age of Onset   Arthritis Mother    Cancer Mother        Colon Cancer at 31 years old   Diabetes Mother    Varicose Veins Mother    Colon cancer Mother    Alcohol abuse Father    Other Father        Heavy Smoker   Varicose Veins Maternal Grandmother    Other Son        PTSD from [place] War   ADD / ADHD Son    Alcohol abuse Son    Depression Son    Depression Son    Breast cancer  Paternal Aunt    Rectal cancer Neg Hx    Stomach cancer Neg Hx    Pancreatic cancer Neg Hx    Esophageal cancer Neg Hx     Social History   Socioeconomic History   Marital status: Divorced    Spouse name: Not on file   Number of children: 2   Years of education: Not on file   Highest education level: Not on file  Occupational History   Not on file  Tobacco Use   Smoking status: Never   Smokeless tobacco: Never  Vaping Use   Vaping Use: Never used  Substance and Sexual Activity   Alcohol use: Never   Drug use: Never   Sexual activity: Not Currently  Other Topics Concern   Not on file  Social History Narrative   Right Handed    Lives in a one story home   Social Determinants of Health   Financial Resource Strain: Low Risk  (08/06/2021)   Overall Financial Resource Strain (CARDIA)    Difficulty of Paying Living Expenses: Not  hard at all  Food Insecurity: No Food Insecurity (08/06/2021)   Hunger Vital Sign    Worried About Running Out of Food in the Last Year: Never true    [place] in the Last Year: Never true  Transportation Needs: No Transportation Needs (08/06/2021)   PRAPARE - Hydrologist (Medical): No    Lack of Transportation (Non-Medical): No  Physical Activity: Sufficiently Active (08/06/2021)   Exercise Vital Sign    Days of Exercise per Week: 4 days    Minutes of Exercise per Session: 60 min  Stress: No Stress Concern Present (08/06/2021)   [place]    Feeling of Stress : Only a little  Social Connections: Moderately Integrated (08/06/2021)   Social Connection and Isolation Panel [NHANES]    Frequency of Communication with Friends and Family: More than three times a week    Frequency of Social Gatherings with Friends and Family: More than three times a week    Attends Religious Services: 1 to 4 times per year    Active Member of [employer] or Organizations: Yes    Attends Archivist Meetings: 1 to 4 times per year    Marital Status: Divorced  Human resources officer Violence: Not At Risk (08/06/2021)   Humiliation, Afraid, Rape, and Kick questionnaire    Fear of Current or Ex-Partner: No    Emotionally Abused: No    Physically Abused: No    Sexually Abused: No    Review of Systems: Positive for none All other review of systems negative except as mentioned in the HPI.  Physical Exam: Vital signs in last 24 hours: '@VSRANGES'$ @   General:   Alert,  Well-developed, well-nourished, pleasant and cooperative in NAD Lungs:  Clear throughout to auscultation.   Heart:  Regular rate and rhythm; no murmurs, clicks, rubs,  or gallops. Abdomen:  Soft, nontender and nondistended. Normal bowel sounds.   Neuro/Psych:  Alert and cooperative. Normal mood and affect. A and O x 3    No significant changes  were identified.  The patient continues to be an appropriate candidate for the planned procedure and anesthesia.   Carmell Austria, MD. [facility] 09/27/2021 11:32 AM@

## 2021-09-27 NOTE — Progress Notes (Signed)
Vital signs checked by:CW ? ?The medical and surgical history was reviewed and verified with the patient. ? ?

## 2021-09-27 NOTE — Patient Instructions (Signed)
Thank you for letting us take care of your healthcare needs today. PLease see handouts given to you on Polyps, Diverticulosis and Hemorrhoids.    YOU HAD AN ENDOSCOPIC PROCEDURE TODAY AT [facility]:   Refer to the procedure report that was given to you for any specific questions about what was found during the examination.  If the procedure report does not answer your questions, please call your gastroenterologist to clarify.  If you requested that your care partner not be given the details of your procedure findings, then the procedure report has been included in a sealed envelope for you to review at your convenience later.  YOU SHOULD EXPECT: Some feelings of bloating in the abdomen. Passage of more gas than usual.  Walking can help get rid of the air that was put into your GI tract during the procedure and reduce the bloating. If you had a lower endoscopy (such as a colonoscopy or flexible sigmoidoscopy) you may notice spotting of blood in your stool or on the toilet paper. If you underwent a bowel prep for your procedure, you may not have a normal bowel movement for a few days.  Please Note:  You might notice some irritation and congestion in your nose or some drainage.  This is from the oxygen used during your procedure.  There is no need for concern and it should clear up in a day or so.  SYMPTOMS TO REPORT IMMEDIATELY:  Following lower endoscopy (colonoscopy or flexible sigmoidoscopy):  Excessive amounts of blood in the stool  Significant tenderness or worsening of abdominal pains  Swelling of the abdomen that is new, acute  Fever of 100F or higher  For urgent or emergent issues, a gastroenterologist can be reached at any hour by calling [phone]. Do not use MyChart messaging for urgent concerns.    DIET:  We do recommend a small meal at first, but then you may proceed to your regular diet.  Drink plenty of fluids but you should avoid alcoholic beverages for 24  hours.  ACTIVITY:  You should plan to take it easy for the rest of today and you should NOT DRIVE or use heavy machinery until tomorrow (because of the sedation medicines used during the test).    FOLLOW UP: Our staff will call the number listed on your records the next business day following your procedure.  We will call around 7:15- 8:00 am to check on you and address any questions or concerns that you may have regarding the information given to you following your procedure. If we do not reach you, we will leave a message.  If you develop any symptoms (ie: fever, flu-like symptoms, shortness of breath, cough etc.) before then, please call [phone].  If you test positive for Covid 19 in the 2 weeks post procedure, please call and report this information to [place].    If any biopsies were taken you will be contacted by phone or by letter within the next 1-3 weeks.  Please call us at [phone] if you have not heard about the biopsies in 3 weeks.    SIGNATURES/CONFIDENTIALITY: You and/or your care partner have signed paperwork which will be entered into your electronic medical record.  These signatures attest to the fact that that the information above on your After Visit Summary has been reviewed and is understood.  Full responsibility of the confidentiality of this discharge information lies with you and/or your care-partner.

## 2021-09-27 NOTE — Progress Notes (Signed)
Called to room to assist during endoscopic procedure.  Patient ID and intended procedure confirmed with present staff. Received instructions for my participation in the procedure from the performing physician.  

## 2021-09-27 NOTE — Op Note (Signed)
[facility] Patient Name: Linda Mcmillan Procedure Date: 09/27/2021 11:35 AM MRN: [id number] Endoscopist: Jackquline Denmark , MD Age: 71 Referring MD:  Date of Birth: 1950-12-30 Gender: Female Account #: [id number] Procedure:                Colonoscopy Indications:              H/O polyps/FH CRC (mom at age 57) Medicines:                Monitored Anesthesia Care Procedure:                Pre-Anesthesia Assessment:                           - Prior to the procedure, a History and Physical                            was performed, and patient medications and                            allergies were reviewed. The patient's tolerance of                            previous anesthesia was also reviewed. The risks                            and benefits of the procedure and the sedation                            options and risks were discussed with the patient.                            All questions were answered, and informed consent                            was obtained. Prior Anticoagulants: The patient has                            taken no previous anticoagulant or antiplatelet                            agents. ASA Grade Assessment: II - A patient with                            mild systemic disease. After reviewing the risks                            and benefits, the patient was deemed in                            satisfactory condition to undergo the procedure.                           After obtaining informed consent, the colonoscope  was passed under direct vision. Throughout the                            procedure, the patient's blood pressure, pulse, and                            oxygen saturations were monitored continuously. The                            PCF-HQ190L Colonoscope was introduced through the                            anus and advanced to the the cecum, identified by                            appendiceal orifice and  ileocecal valve. The                            colonoscopy was performed without difficulty,                            assisted by abdominal pressure. The patient                            tolerated the procedure well. The quality of the                            bowel preparation was good. The ileocecal valve,                            appendiceal orifice, and rectum were photographed. Scope In: 11:41:36 AM Scope Out: 11:57:44 AM Scope Withdrawal Time: 0 hours 10 minutes 51 seconds  Total Procedure Duration: 0 hours 16 minutes 8 seconds  Findings:                 A 4 mm polyp was found in the mid transverse colon.                            The polyp was sessile. The polyp was removed with a                            cold snare. Resection and retrieval were complete.                           A few rare small-mouthed diverticula were found in                            the sigmoid colon and ascending colon.                           Non-bleeding internal hemorrhoids were found during                            retroflexion. The hemorrhoids  were small and Grade                            I (internal hemorrhoids that do not prolapse).                           The exam was otherwise without abnormality on                            direct and retroflexion views. Note that the colon                            was highly redundant. Complications:            No immediate complications. Estimated Blood Loss:     Estimated blood loss: none. Impression:               - One 4 mm polyp in the mid transverse colon,                            removed with a cold snare. Resected and retrieved.                           - Minimal pancolonic diverticulosis.                           - Non-bleeding internal hemorrhoids.                           - The examination was otherwise normal on direct                            and retroflexion views. Recommendation:           - Patient has a contact  number available for                            emergencies. The signs and symptoms of potential                            delayed complications were discussed with the                            patient. Return to normal activities tomorrow.                            Written discharge instructions were provided to the                            patient.                           - Resume previous diet.                           - Continue present medications.                           -  Await pathology results.                           - Repeat colonoscopy is not recommended for                            surveillance.                           - The findings and recommendations were discussed                            with the patient's family. Jackquline Denmark, MD 09/27/2021 12:02:26 PM This report has been signed electronically.

## 2021-09-27 NOTE — Progress Notes (Signed)
1155 Ephedrine 10 mg given IV due to low BP, MD updated.

## 2021-09-28 ENCOUNTER — Telehealth: Payer: Self-pay

## 2021-09-28 NOTE — Telephone Encounter (Signed)
  Follow up Call-     09/27/2021   10:38 AM  Call back number  Post procedure Call Back phone  # [phone]  Permission to leave phone message Yes     Patient questions:  Do you have a fever, pain , or abdominal swelling? No. Pain Score  0 *  Have you tolerated food without any problems? Yes.    Have you been able to return to your normal activities? Yes.    Do you have any questions about your discharge instructions: Diet   No. Medications  No. Follow up visit  No.  Do you have questions or concerns about your Care? No.  Actions: * If pain score is 4 or above: No action needed, pain <4.

## 2021-09-29 ENCOUNTER — Encounter: Payer: Medicare Other | Admitting: Gastroenterology

## 2021-10-02 ENCOUNTER — Encounter: Payer: Self-pay | Admitting: Gastroenterology

## 2021-10-04 ENCOUNTER — Encounter: Payer: Self-pay | Admitting: Neurology

## 2021-10-04 ENCOUNTER — Other Ambulatory Visit: Payer: Self-pay | Admitting: *Deleted

## 2021-10-04 ENCOUNTER — Encounter: Payer: Self-pay | Admitting: Family Medicine

## 2021-10-04 MED ORDER — LEVOTHYROXINE SODIUM 75 MCG PO TABS: ORAL_TABLET | ORAL | 1 refills | Status: DC

## 2021-10-05 ENCOUNTER — Other Ambulatory Visit: Payer: Self-pay

## 2021-10-05 DIAGNOSIS — G2581 Restless legs syndrome: Secondary | ICD-10-CM

## 2021-10-05 MED ORDER — CLONAZEPAM 0.5 MG PO TABS: 0.5000 mg | ORAL_TABLET | Freq: Every day | ORAL | 1 refills | Status: DC

## 2021-10-05 MED ORDER — ROPINIROLE HCL 1 MG PO TABS: ORAL_TABLET | ORAL | 1 refills | Status: DC

## 2021-10-05 MED ORDER — GABAPENTIN 600 MG PO TABS: ORAL_TABLET | ORAL | 1 refills | Status: DC

## 2021-10-19 ENCOUNTER — Ambulatory Visit: Payer: Medicare Other | Admitting: Psychology

## 2021-10-31 ENCOUNTER — Other Ambulatory Visit: Payer: Self-pay | Admitting: Family

## 2021-11-01 ENCOUNTER — Encounter: Payer: Self-pay | Admitting: Family Medicine

## 2021-11-01 ENCOUNTER — Ambulatory Visit (INDEPENDENT_AMBULATORY_CARE_PROVIDER_SITE_OTHER): Payer: Medicare Other | Admitting: Family Medicine

## 2021-11-01 VITALS — BP 108/60 | HR 76 | Temp 97.9°F | Ht 61.75 in | Wt 116.1 lb

## 2021-11-01 DIAGNOSIS — Z23 Encounter for immunization: Secondary | ICD-10-CM | POA: Diagnosis not present

## 2021-11-01 DIAGNOSIS — E039 Hypothyroidism, unspecified: Secondary | ICD-10-CM

## 2021-11-01 DIAGNOSIS — E782 Mixed hyperlipidemia: Secondary | ICD-10-CM

## 2021-11-01 DIAGNOSIS — R300 Dysuria: Secondary | ICD-10-CM | POA: Diagnosis not present

## 2021-11-01 DIAGNOSIS — R7303 Prediabetes: Secondary | ICD-10-CM | POA: Insufficient documentation

## 2021-11-01 DIAGNOSIS — I700 Atherosclerosis of aorta: Secondary | ICD-10-CM

## 2021-11-01 DIAGNOSIS — G2581 Restless legs syndrome: Secondary | ICD-10-CM

## 2021-11-01 DIAGNOSIS — Z1159 Encounter for screening for other viral diseases: Secondary | ICD-10-CM

## 2021-11-01 LAB — POCT URINALYSIS DIPSTICK
Bilirubin, UA: NEGATIVE
Blood, UA: NEGATIVE
Glucose, UA: NEGATIVE
Ketones, UA: NEGATIVE
Leukocytes, UA: NEGATIVE
Nitrite, UA: POSITIVE
Protein, UA: NEGATIVE
Spec Grav, UA: 1.01 (ref 1.010–1.025)
Urobilinogen, UA: 0.2 E.U./dL
pH, UA: 6 (ref 5.0–8.0)

## 2021-11-01 LAB — LIPID PANEL
Cholesterol: 143 mg/dL (ref 0–200)
HDL: 52.1 mg/dL (ref >39.00)
LDL Cholesterol: 69 mg/dL (ref 0–99)
NonHDL: 91.27
Total CHOL/HDL Ratio: 3
Triglycerides: 111 mg/dL (ref 0.0–149.0)
VLDL: 22.2 mg/dL (ref 0.0–40.0)

## 2021-11-01 LAB — HEMOGLOBIN A1C: Hgb A1c MFr Bld: 6 % (ref 4.6–6.5)

## 2021-11-01 LAB — TSH: TSH: 1 u[IU]/mL (ref 0.35–5.50)

## 2021-11-01 NOTE — Progress Notes (Signed)
Subjective:     Patient ID: Linda Mcmillan, female    DOB: 1950-05-21, 71 y.o.   MRN: [id number]  Chief Complaint  Patient presents with   Follow-up    Follow-up on thyroid, need blood work    Burning with urination    Started 2 to 3 weeks ago     HPI  Hypothyroidism-taking synthroid 75.  Doing well.  No issues.  RLS-on requip 1 mg, gabapentin, and clonazepam-followed by neuro.  Working well.   Dysuria for 2-3 wks.  Using estrace cream.  More as going.  Some freq(drinks a lot).  Not getting worse.  No abd pain. No vag itch/d/c.  Was using vagisil. Now using Dove.  Not help.   On doxy for eyes-round 2.   HLD-taking Crestor '10mg'$ .  Card.  Working on diet. Some exercise.   Pre DM-A1C has been on border-working on it.   Health Maintenance Due  Topic Date Due   Hepatitis C Screening  Never done   DEXA SCAN  Never done   INFLUENZA VACCINE  09/14/2021    Past Medical History:  Diagnosis Date   Anxiety 2018   Arthritis Neck, back (herniated lumbar disc   Atherosclerosis    Cataract Surgery, 2020   Depression 2016   Hyperlipidemia 10 years   Neuromuscular disorder ([place]) Restless leg syndrome, 10 years   Thyroid disease    hypothyroidism    Past Surgical History:  Procedure Laterality Date   ABDOMINAL HYSTERECTOMY  20 years ago   cataract surgery  2020   COLONOSCOPY  09/27/2021   [facility]   EUS  07/09/2020   [facility]   EYE SURGERY Bilateral     Outpatient Medications Prior to Visit  Medication Sig Dispense Refill   clonazePAM (KLONOPIN) 0.5 MG tablet Take 1 tablet (0.5 mg total) by mouth at bedtime. 90 tablet 1   doxycycline (PERIOSTAT) 20 MG tablet      estradiol (ESTRACE) 0.1 MG/GM vaginal cream 0.5 g intravaginally 3 times per week. 42.5 g 0   gabapentin (NEURONTIN) 600 MG tablet Take 1 tablet at 6p and 8p. 180 tablet 1   levothyroxine (SYNTHROID) 75 MCG tablet Synthroid 75 mcg tablet 90 tablet 1   rOPINIRole (REQUIP) 1 MG tablet Take 1 tablet  at 6p 90 tablet 1   rosuvastatin (CRESTOR) 10 MG tablet Take 1 tablet by mouth daily.     No facility-administered medications prior to visit.    Allergies  Allergen Reactions   Sulfa Antibiotics Nausea Only and Rash    Other reaction(s): Headache   ROS neg/noncontributory except as noted HPI/below      Objective:     BP 108/60   Pulse 76   Temp 97.9 F (36.6 C) (Temporal)   Ht 5' 1.75" (1.568 m)   Wt 116 lb 2 oz (52.7 kg)   SpO2 96%   BMI 21.41 kg/m  Wt Readings from Last 3 Encounters:  11/01/21 116 lb 2 oz (52.7 kg)  09/27/21 116 lb (52.6 kg)  07/30/21 116 lb 8 oz (52.8 kg)    Physical Exam   Gen: WDWN NAD HEENT: NCAT, conjunctiva not injected, sclera nonicteric NECK:  supple, no thyromegaly, no nodes, no carotid bruits CARDIAC: RRR, S1S2+, no murmur. DP 2+B LUNGS: CTAB. No wheezes ABDOMEN:  BS+, soft, NTND, No HSM, no masses EXT:  no edema MSK: no gross abnormalities.  NEURO: A&O x3.  CN II-XII intact.  PSYCH: normal mood. Good eye contact  Results  for orders placed or performed in visit on 11/01/21  POCT urinalysis dipstick  Result Value Ref Range   Color, UA YELLOW    Clarity, UA CLOUDY    Glucose, UA Negative Negative   Bilirubin, UA NEG    Ketones, UA NEG    Spec Grav, UA 1.010 1.010 - 1.025   Blood, UA NEG    pH, UA 6.0 5.0 - 8.0   Protein, UA Negative Negative   Urobilinogen, UA 0.2 0.2 or 1.0 E.U./dL   Nitrite, UA POS    Leukocytes, UA Negative Negative   Appearance     Odor           Assessment & Plan:   Problem List Items Addressed This Visit       Cardiovascular and Mediastinum   Atherosclerosis of aorta (HCC)     Endocrine   Acquired hypothyroidism - Primary   Relevant Orders   TSH     Other   Mixed hyperlipidemia   Relevant Orders   Lipid panel   Prediabetes   Relevant Orders   Hemoglobin A1c   Other Visit Diagnoses     RLS (restless legs syndrome)       Encounter for hepatitis C screening test for low risk  patient       Relevant Orders   Hepatitis C antibody   Dysuria       Relevant Orders   POCT urinalysis dipstick   Urine Culture      Hypothyroidism-chronic.  Controlled.  Cont synthroid 75.  Check TSH preDM-chronic. Controlled.   working on diet/exercise-check A1C HLD-and Aortic atherosclerosis-chronic  controlled. On crestor '10mg'$  check lipids.  Cmp checked in June RLS-chronic  controlled on meds.  Cont.  Managed by neuro Dysuria-doubt UTI-but check ua and cx as h/o freq uti.  May be vag irrit, other.  Use water only-no soap.  F/u end of wk or next wk for vag exam. Can cancel if UA/Cx + and will need to refer to urol then.   Nitrites + but rest neg-await cx   No orders of the defined types were placed in this encounter.   Wellington Hampshire, MD

## 2021-11-01 NOTE — Patient Instructions (Signed)
It was very nice to see you today!  Try no no soap   PLEASE NOTE:  If you had any lab tests please let us know if you have not heard back within a few days. You may see your results on MyChart before we have a chance to review them but we will give you a call once they are reviewed by [place]. If we ordered any referrals today, please let us know if you have not heard from their office within the next week.   Please try these tips to maintain a healthy lifestyle:  Eat most of your calories during the day when you are active. Eliminate processed foods including packaged sweets (pies, cakes, cookies), reduce intake of potatoes, white bread, white pasta, and white rice. Look for whole grain options, oat flour or almond flour.  Each meal should contain half fruits/vegetables, one quarter protein, and one quarter carbs (no bigger than a computer mouse).  Cut down on sweet beverages. This includes juice, soda, and sweet tea. Also watch fruit intake, though this is a healthier sweet option, it still contains natural sugar! Limit to 3 servings daily.  Drink at least 1 glass of water with each meal and aim for at least 8 glasses per day  Exercise at least 150 minutes every week.

## 2021-11-02 ENCOUNTER — Encounter: Payer: Self-pay | Admitting: Family Medicine

## 2021-11-02 NOTE — Progress Notes (Signed)
Cholesterol and thyroid at goal-continue same doses. A1c is still elevated-continue working on diet/exercise to prevent diabetes

## 2021-11-04 ENCOUNTER — Other Ambulatory Visit: Payer: Self-pay | Admitting: Family Medicine

## 2021-11-04 LAB — URINE CULTURE
MICRO NUMBER:: 13931257
SPECIMEN QUALITY:: ADEQUATE

## 2021-11-04 LAB — HEPATITIS C ANTIBODY: Hepatitis C Ab: NONREACTIVE

## 2021-11-04 MED ORDER — CIPROFLOXACIN HCL 500 MG PO TABS: 500.0000 mg | ORAL_TABLET | Freq: Two times a day (BID) | ORAL | 0 refills | Status: DC

## 2021-11-04 NOTE — Progress Notes (Signed)
Hold rosuvastatin.while on the cipro.  D/w pt.  Sent cipro '500mg'$  bid x 7d.

## 2021-11-05 ENCOUNTER — Ambulatory Visit: Payer: Medicare Other | Admitting: Family Medicine

## 2021-11-15 ENCOUNTER — Telehealth: Payer: Self-pay

## 2021-11-15 NOTE — Telephone Encounter (Signed)
Left message for pt to call back  °

## 2021-11-15 NOTE — Telephone Encounter (Signed)
Patient returned your call, please advise. 

## 2021-11-15 NOTE — Telephone Encounter (Signed)
Pt was made aware that we received a reminder for the pt to come in and have labs done. Pt stated that she had it marked on her calender as well. Order is in Epic: Pt stated that she is going out of town and will have labs drawn on the 17th of this month: Pt verbalized understanding with all questions answered.

## 2021-11-15 NOTE — Telephone Encounter (Signed)
-----   Message from Algernon Huxley, RN sent at 08/13/2021 10:10 AM EDT ----- Regarding: LFT's Pt needs LFT's, order in epic.

## 2021-12-09 ENCOUNTER — Other Ambulatory Visit (INDEPENDENT_AMBULATORY_CARE_PROVIDER_SITE_OTHER): Payer: Medicare Other

## 2021-12-09 DIAGNOSIS — K8689 Other specified diseases of pancreas: Secondary | ICD-10-CM | POA: Diagnosis not present

## 2021-12-09 LAB — HEPATIC FUNCTION PANEL
ALT: 24 U/L (ref 0–35)
AST: 28 U/L (ref 0–37)
Albumin: 4.4 g/dL (ref 3.5–5.2)
Alkaline Phosphatase: 49 U/L (ref 39–117)
Bilirubin, Direct: 0.1 mg/dL (ref 0.0–0.3)
Total Bilirubin: 0.3 mg/dL (ref 0.2–1.2)
Total Protein: 7.3 g/dL (ref 6.0–8.3)

## 2021-12-19 ENCOUNTER — Encounter: Payer: Self-pay | Admitting: Gastroenterology

## 2021-12-27 ENCOUNTER — Other Ambulatory Visit (HOSPITAL_BASED_OUTPATIENT_CLINIC_OR_DEPARTMENT_OTHER): Payer: Self-pay

## 2021-12-27 MED ORDER — COMIRNATY 30 MCG/0.3ML IM SUSY
PREFILLED_SYRINGE | INTRAMUSCULAR | 0 refills | Status: DC
  Filled 2021-12-27: qty 0.3, 1d supply, fill #0

## 2022-01-05 ENCOUNTER — Other Ambulatory Visit: Payer: Self-pay | Admitting: Family Medicine

## 2022-01-05 DIAGNOSIS — Z780 Asymptomatic menopausal state: Secondary | ICD-10-CM

## 2022-01-05 MED ORDER — ESTRADIOL 0.1 MG/GM VA CREA: TOPICAL_CREAM | VAGINAL | 0 refills | Status: DC

## 2022-02-21 ENCOUNTER — Telehealth: Payer: Self-pay

## 2022-02-21 ENCOUNTER — Other Ambulatory Visit: Payer: Self-pay

## 2022-02-21 DIAGNOSIS — K760 Fatty (change of) liver, not elsewhere classified: Secondary | ICD-10-CM

## 2022-02-21 DIAGNOSIS — K8689 Other specified diseases of pancreas: Secondary | ICD-10-CM

## 2022-02-21 NOTE — Telephone Encounter (Signed)
Received personal reminder in Epic:   Message Received: 2 days ago  Personal reminder Gillermina Hu, RN  Gillermina Hu, RN   Remo Lipps, please check LFTs amylase/lipase every 3 monthly x 1 year.  MRCP in 1 year      Orders for labs placed placed in Epic: Pt made aware to come by our lab near 03/11/2021 to have labs drawn: Another reminder placed in Epic: Pt made aware: Pt verbalized understanding with all questions answered.

## 2022-03-05 ENCOUNTER — Encounter: Payer: Self-pay | Admitting: Family Medicine

## 2022-03-08 ENCOUNTER — Other Ambulatory Visit: Payer: Self-pay | Admitting: *Deleted

## 2022-03-08 MED ORDER — ROSUVASTATIN CALCIUM 10 MG PO TABS: 10.0000 mg | ORAL_TABLET | Freq: Every day | ORAL | 1 refills | Status: DC

## 2022-03-14 ENCOUNTER — Ambulatory Visit (INDEPENDENT_AMBULATORY_CARE_PROVIDER_SITE_OTHER): Payer: Medicare Other | Admitting: Neurology

## 2022-03-14 ENCOUNTER — Encounter: Payer: Self-pay | Admitting: Neurology

## 2022-03-14 ENCOUNTER — Other Ambulatory Visit (INDEPENDENT_AMBULATORY_CARE_PROVIDER_SITE_OTHER): Payer: Medicare Other

## 2022-03-14 DIAGNOSIS — K8689 Other specified diseases of pancreas: Secondary | ICD-10-CM | POA: Diagnosis not present

## 2022-03-14 DIAGNOSIS — G2581 Restless legs syndrome: Secondary | ICD-10-CM

## 2022-03-14 DIAGNOSIS — K760 Fatty (change of) liver, not elsewhere classified: Secondary | ICD-10-CM

## 2022-03-14 LAB — AMYLASE: Amylase: 52 U/L (ref 27–131)

## 2022-03-14 LAB — HEPATIC FUNCTION PANEL
ALT: 29 U/L (ref 0–35)
AST: 30 U/L (ref 0–37)
Albumin: 4.4 g/dL (ref 3.5–5.2)
Alkaline Phosphatase: 42 U/L (ref 39–117)
Bilirubin, Direct: 0.1 mg/dL (ref 0.0–0.3)
Total Bilirubin: 0.5 mg/dL (ref 0.2–1.2)
Total Protein: 7.4 g/dL (ref 6.0–8.3)

## 2022-03-14 LAB — LIPASE: Lipase: 41 U/L (ref 11.0–59.0)

## 2022-03-14 MED ORDER — CLONAZEPAM 0.5 MG PO TABS: 0.5000 mg | ORAL_TABLET | Freq: Every day | ORAL | 1 refills | Status: DC

## 2022-03-14 MED ORDER — ROPINIROLE HCL 1 MG PO TABS: ORAL_TABLET | ORAL | 3 refills | Status: DC

## 2022-03-14 MED ORDER — GABAPENTIN 600 MG PO TABS: ORAL_TABLET | ORAL | 3 refills | Status: DC

## 2022-03-14 NOTE — Progress Notes (Signed)
Follow-up Visit   Date: 03/14/2022    Linda Mcmillan MRN: [id number] DOB: 11/25/1950    Linda Mcmillan is a 72 y.o. right-handed Caucasian female with hypertension and hyperlipidemia returning to the clinic for follow-up of restless leg syndrome.  The patient was accompanied to the clinic by self.   IMPRESSION/PLAN: Restless leg syndrome, well-controlled  - Previously tried:  pramipexole, oxycodone - Continue gabapentin '600mg'$  at 6p and 8p - Continue ropinirole '1mg'$  at bedtime - Continue clonazepam 0.'5mg'$  at bedtime  Patient was reassured that I did not see any primary neurological condition causing her head bump, temporal veins, right wrist pain, or left foot cramp.  She was advised to avoid researching symptoms online.    Return to clinic in 1 year.   --------------------------------------------- History of present illness: For the last 12 years, she has restless sensation of the feet.  Symptoms are worse at rest and improved with activity. She has previously tried: pramipexole, oxycodone and was followed at [facility] in 2016. After moving to [place], [place] she established care with Dr. Volanda Napoleon at [facility] and more recently followed by Dr. Lelon Mast.  For the past two years, she has been on gabapentin '600mg'$  at 6p and 10p, ropinirole '1mg'$  at 6p, and clonazepam 0.'5mg'$  at bedtime.  Most of the time (95%), her RLS is well-controlled on this regimen. She rarely needs to get up at night to walk around.  She moved to [place] from [place], [place] in December and would like to establish care.  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 She lives alone.    UPDATE 03/14/2022:  She is here for follow-up visit.  Overall, restless leg is well-controlled on her current regimen.  Occasionally in the evenings, she needs to stretch the legs both she is able to sleep well through the night.   She has many other complains including a bump on her head following blunt injury to her head after hitting the forehead on her mantle, increased fullness of the veins over her temple region, right ganglion cyst / wrist pain, episodic cramping of the left foot.  She googles her symptoms a lot online and has many questions.    Medications:  Current Outpatient Medications on File Prior to Visit  Medication Sig Dispense Refill   clonazePAM (KLONOPIN) 0.5 MG tablet Take 1 tablet (0.5 mg total) by mouth at bedtime. 90 tablet 1   COVID-19 mRNA vaccine 2023-2024 (COMIRNATY) syringe Inject into the muscle. 0.3 mL 0   estradiol (ESTRACE) 0.1 MG/GM vaginal cream 0.5 g intravaginally 3 times per week. 42.5 g 0   gabapentin (NEURONTIN) 600 MG tablet Take 1 tablet at 6p and 8p. 180 tablet 1   rOPINIRole (REQUIP) 1 MG tablet Take 1 tablet at 6p 90 tablet 1   rosuvastatin (CRESTOR) 10 MG tablet Take 1 tablet (10 mg total) by mouth daily. 90 tablet 1   SYNTHROID 75 MCG tablet TAKE 1 TABLET BY MOUTH DAILY 90 tablet 1   No current facility-administered medications on file prior to visit.    Allergies:  Allergies  Allergen Reactions   Sulfa Antibiotics Nausea Only and Rash    Other reaction(s): Headache    Vital Signs:  BP 108/66   Pulse 78   Ht 5' 1.75" (1.568 m)   Wt 117 lb (53.1 kg)   SpO2 96%   BMI 21.57 kg/m    Neurological Exam: MENTAL  STATUS including orientation to time, place, person, recent and remote memory, attention span and concentration, language, and fund of knowledge is normal.  Speech is not dysarthric.  CRANIAL NERVES:  Pupils equal round and reactive to light.  Normal conjugate, extra-ocular eye movements in all directions of gaze.  No ptosis.  Face is symmetric. Palate elevates symmetrically.  Tongue is midline.  I palpated her forehead and noticed a mild ridge, without edema  MOTOR:  Motor strength is 5/5 in all extremities.  No atrophy, fasciculations or abnormal movements.   No pronator drift.  Tone is normal.    MSRs:  Reflexes are 2+/4 throughout .  SENSORY:  Intact to vibration throughout .  COORDINATION/GAIT:  Normal finger-to- nose-finger.  Intact rapid alternating movements bilaterally.  Gait narrow based and stable.   Data: n/a     Thank you for allowing me to participate in patient's care.  If I can answer any additional questions, I would be pleased to do so.    Sincerely,    Wes Lezotte K. Posey Pronto, DO

## 2022-05-04 ENCOUNTER — Other Ambulatory Visit: Payer: Self-pay | Admitting: Family Medicine

## 2022-05-04 DIAGNOSIS — Z780 Asymptomatic menopausal state: Secondary | ICD-10-CM

## 2022-05-04 MED ORDER — ESTRADIOL 0.1 MG/GM VA CREA: TOPICAL_CREAM | VAGINAL | 2 refills | Status: DC

## 2022-05-20 ENCOUNTER — Institutional Professional Consult (permissible substitution): Payer: Medicare Other | Admitting: Plastic Surgery

## 2022-05-23 ENCOUNTER — Other Ambulatory Visit: Payer: Self-pay

## 2022-05-23 ENCOUNTER — Telehealth: Payer: Self-pay

## 2022-05-23 DIAGNOSIS — K8689 Other specified diseases of pancreas: Secondary | ICD-10-CM

## 2022-05-23 DIAGNOSIS — K760 Fatty (change of) liver, not elsewhere classified: Secondary | ICD-10-CM

## 2022-05-23 NOTE — Telephone Encounter (Signed)
-----   Message from Emeline Darling, RN sent at 08/19/2021  8:17 AM EDT -----  Viviann Spare, please check LFTs amylase/lipase every 3 monthly x 1 year.  MRCP in 1 year

## 2022-05-23 NOTE — Telephone Encounter (Signed)
Reminder received in Epic. Pt made aware: Orders for labs placed in Epic. Pt made aware to come at the end of the month.  Reminder placed for the pt to have repeat MRCP  and reminder placed in 3 months to repeat labs.  Pt made aware. Pt verbalized understanding with all questions answered.

## 2022-06-15 ENCOUNTER — Other Ambulatory Visit (INDEPENDENT_AMBULATORY_CARE_PROVIDER_SITE_OTHER): Payer: Medicare Other

## 2022-06-15 DIAGNOSIS — K760 Fatty (change of) liver, not elsewhere classified: Secondary | ICD-10-CM

## 2022-06-15 DIAGNOSIS — K8689 Other specified diseases of pancreas: Secondary | ICD-10-CM | POA: Diagnosis not present

## 2022-06-15 LAB — LIPASE: Lipase: 55 U/L (ref 11.0–59.0)

## 2022-06-15 LAB — HEPATIC FUNCTION PANEL
ALT: 20 U/L (ref 0–35)
AST: 27 U/L (ref 0–37)
Albumin: 4.2 g/dL (ref 3.5–5.2)
Alkaline Phosphatase: 39 U/L (ref 39–117)
Bilirubin, Direct: 0.1 mg/dL (ref 0.0–0.3)
Total Bilirubin: 0.3 mg/dL (ref 0.2–1.2)
Total Protein: 7 g/dL (ref 6.0–8.3)

## 2022-06-15 LAB — AMYLASE: Amylase: 53 U/L (ref 27–131)

## 2022-06-24 IMAGING — MG MM DIGITAL SCREENING BILAT W/ TOMO AND CAD
8 series · 9 of 24 positions shown · non-contrast
Comparison: None.

CLINICAL DATA: Screening.

EXAM:
DIGITAL SCREENING BILATERAL MAMMOGRAM WITH TOMOSYNTHESIS AND CAD
TECHNIQUE: Bilateral screening digital craniocaudal and mediolateral oblique
mammograms were obtained. Bilateral screening digital breast
tomosynthesis was performed. The images were evaluated with
computer-aided detection.

[R CC synth-2D]
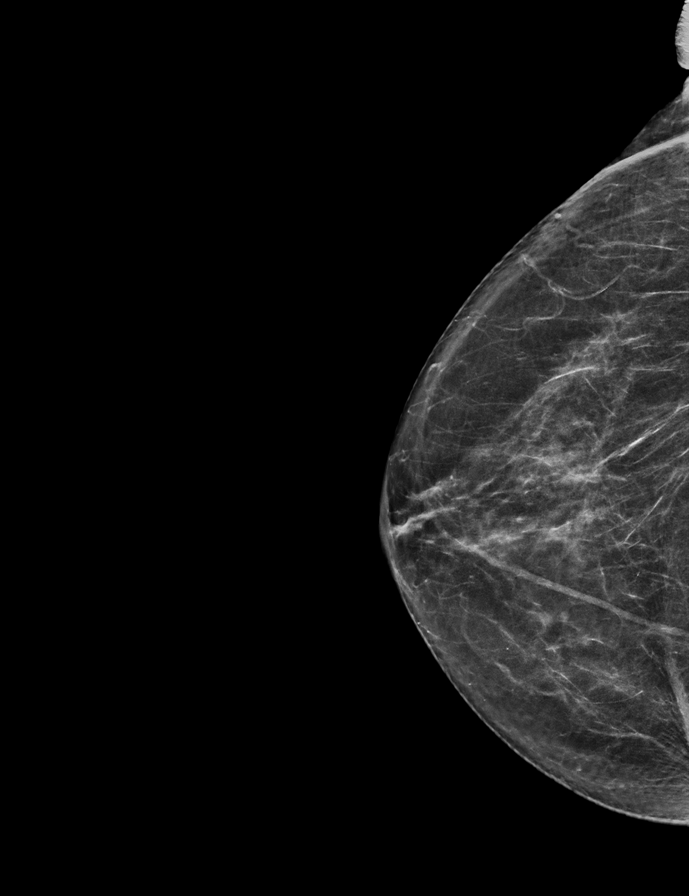

[L MLO synth-2D]
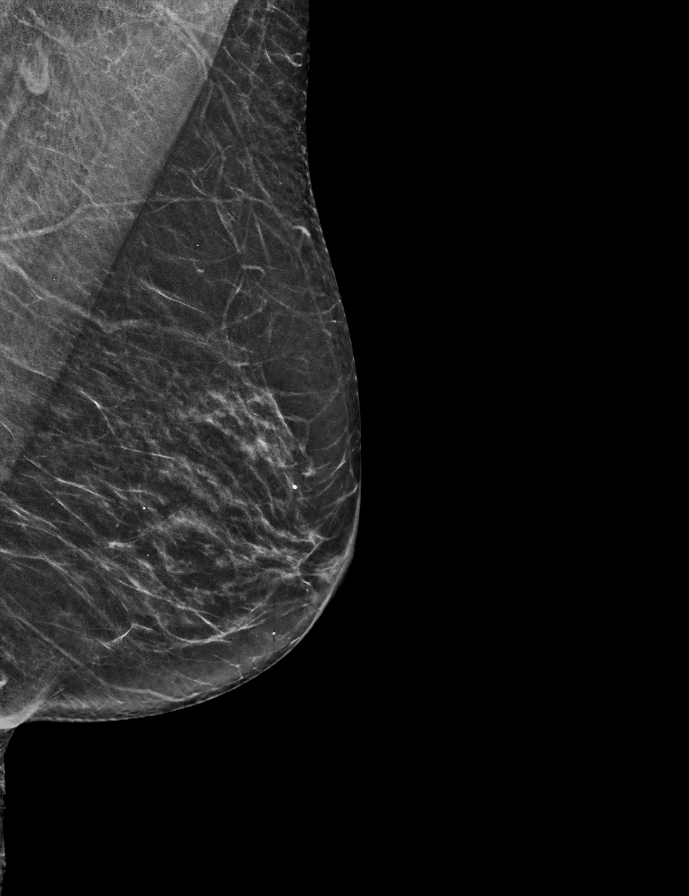

[L CC synth-2D]
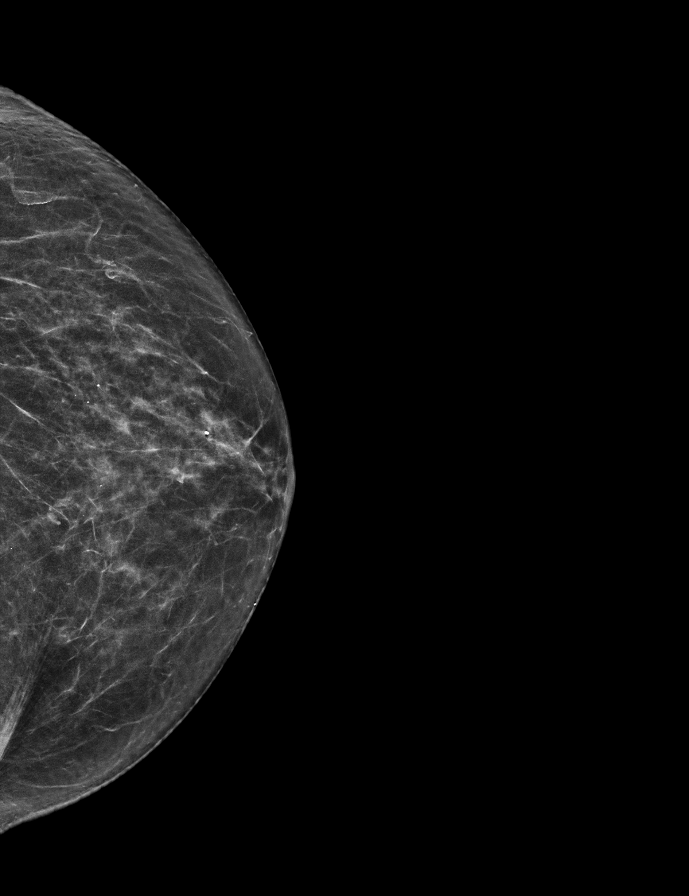

[R MLO synth-2D]
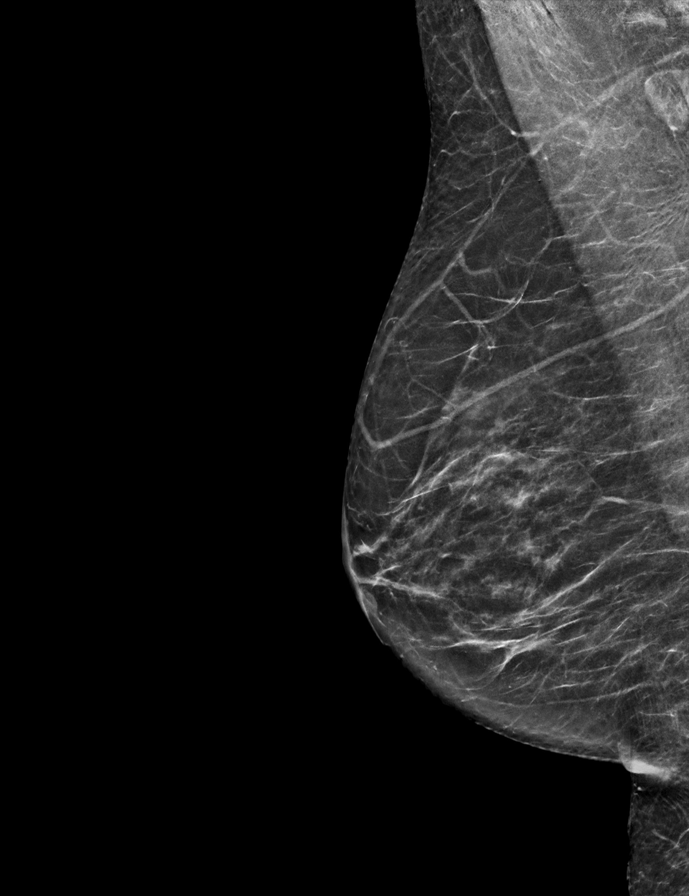

[R CC tomo · 2 of 60 frames shown]
[frame 20/60]
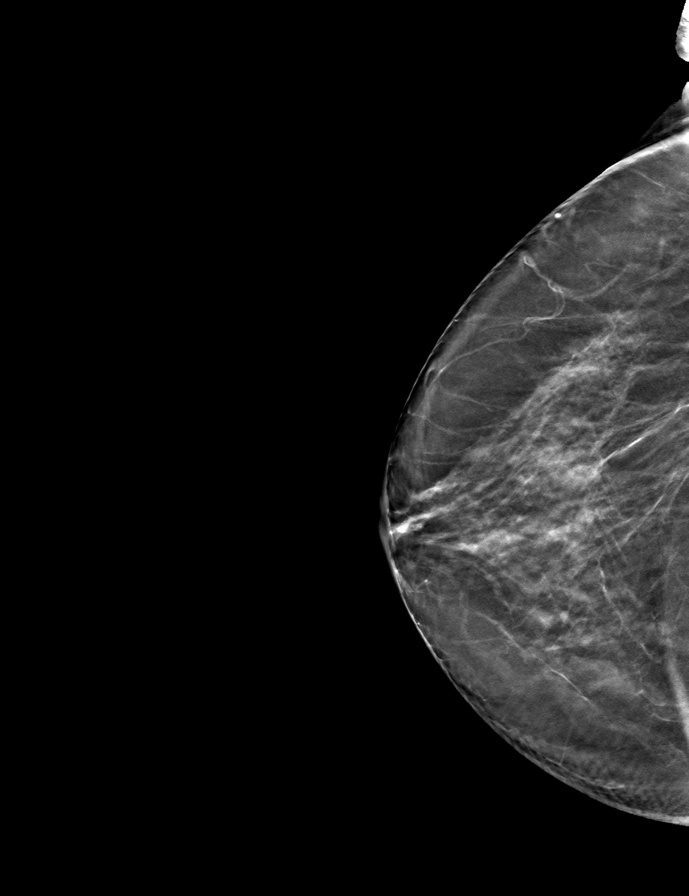
[frame 31/60]
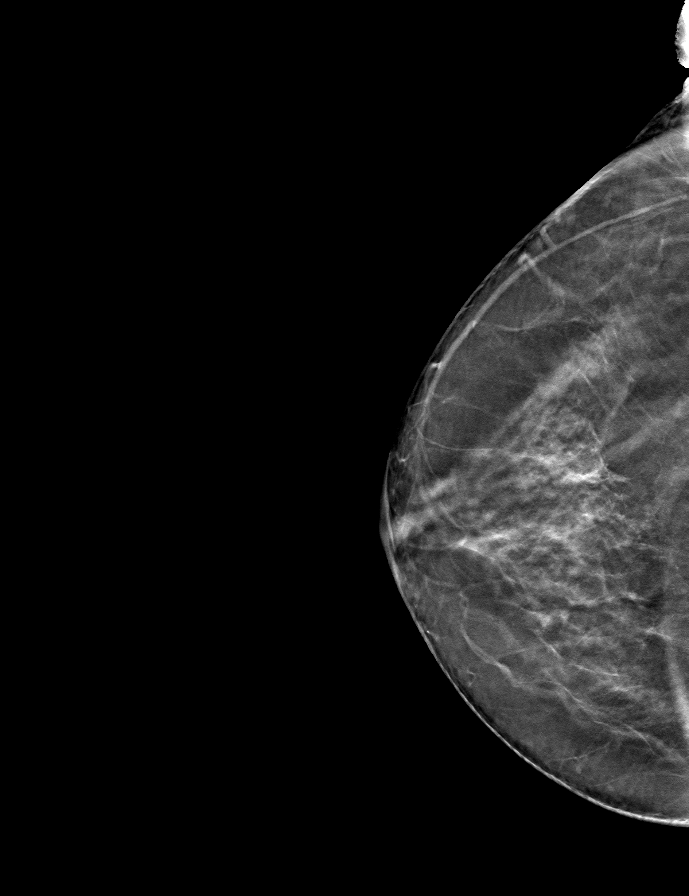

[L MLO tomo · tomo slice 28/55.0]
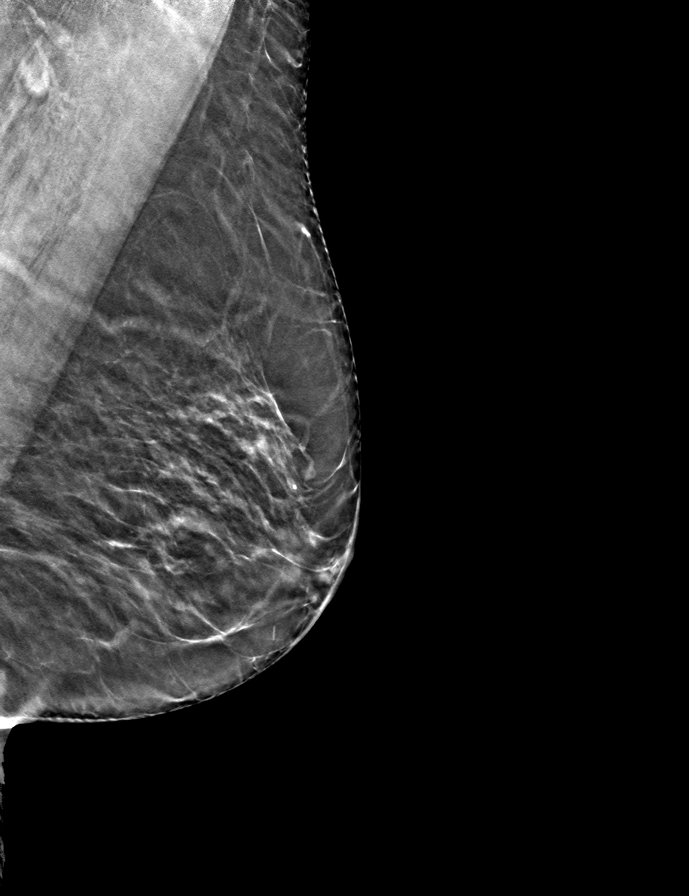

[L CC tomo · tomo slice 29/58.0]
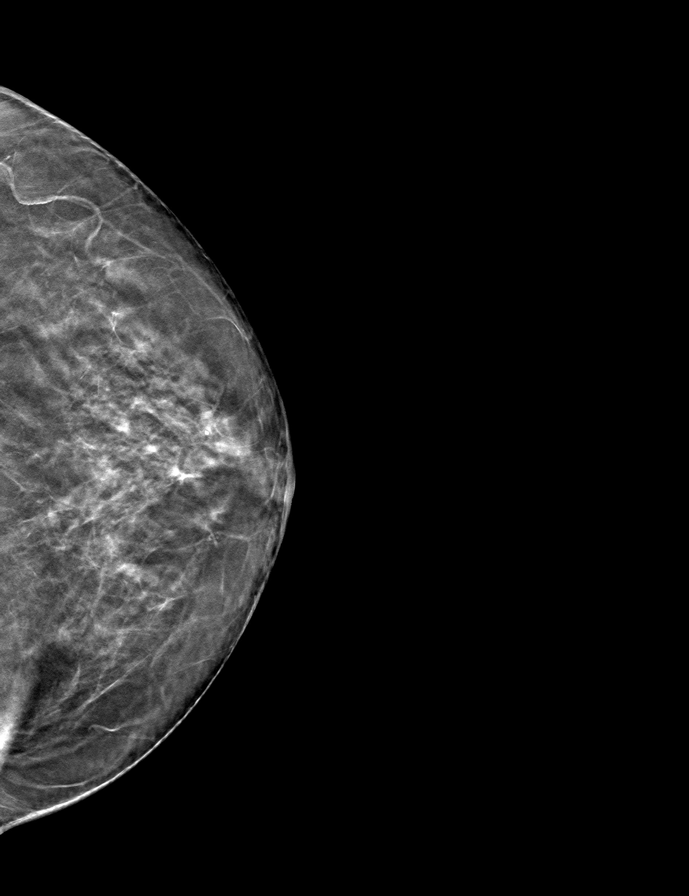

[R MLO tomo · tomo slice 29/57.0]
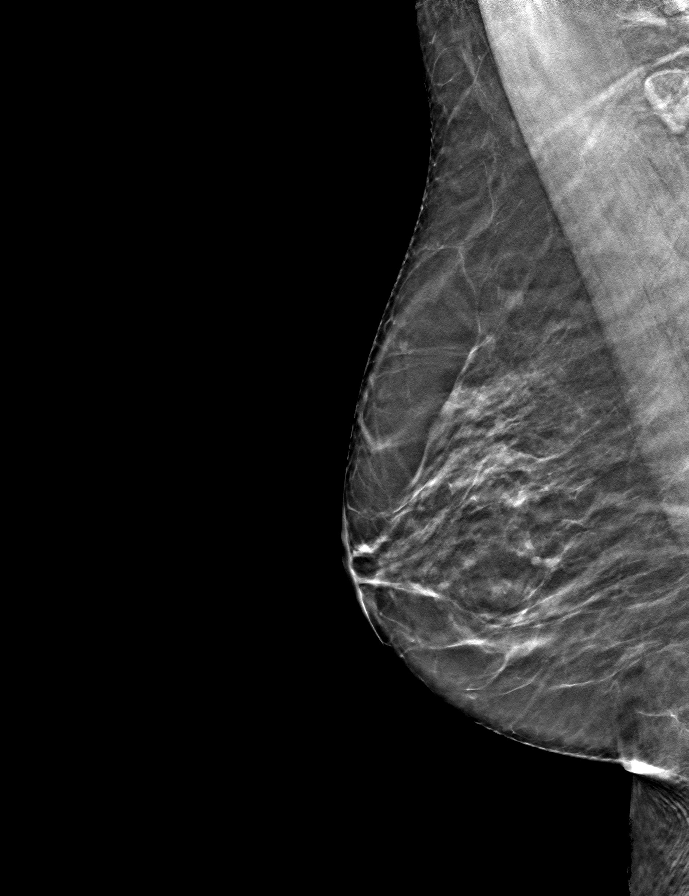

[9 of 24 positions shown; findings below may reference images not displayed]

ACR Breast Density Category b: There are scattered areas of
fibroglandular density.
FINDINGS: There are no findings suspicious for malignancy.
IMPRESSION: No mammographic evidence of malignancy. A result letter of this
screening mammogram will be mailed directly to the patient.

RECOMMENDATION:
Screening mammogram in one year. (Code:XG-X-X7B)

BI-RADS CATEGORY  1: Negative.

## 2022-07-20 ENCOUNTER — Other Ambulatory Visit: Payer: Self-pay | Admitting: Family Medicine

## 2022-07-20 DIAGNOSIS — Z1231 Encounter for screening mammogram for malignant neoplasm of breast: Secondary | ICD-10-CM

## 2022-07-21 ENCOUNTER — Ambulatory Visit (HOSPITAL_BASED_OUTPATIENT_CLINIC_OR_DEPARTMENT_OTHER)
Admission: RE | Admit: 2022-07-21 | Discharge: 2022-07-21 | Disposition: A | Discharge status: Home / Self Care | Payer: Medicare Other | Source: Ambulatory Visit | Attending: Family Medicine | Admitting: Family Medicine

## 2022-07-21 DIAGNOSIS — Z1231 Encounter for screening mammogram for malignant neoplasm of breast: Secondary | ICD-10-CM | POA: Insufficient documentation

## 2022-07-22 ENCOUNTER — Encounter: Payer: Self-pay | Admitting: Family Medicine

## 2022-07-25 ENCOUNTER — Other Ambulatory Visit: Payer: Self-pay | Admitting: Family Medicine

## 2022-07-25 DIAGNOSIS — R928 Other abnormal and inconclusive findings on diagnostic imaging of breast: Secondary | ICD-10-CM

## 2022-07-25 NOTE — Telephone Encounter (Signed)
Patient requests call back asap - concerned about imaging results.

## 2022-07-26 ENCOUNTER — Ambulatory Visit
Admission: RE | Admit: 2022-07-26 | Discharge: 2022-07-26 | Disposition: A | Discharge status: Home / Self Care | Payer: Medicare Other | Source: Ambulatory Visit | Attending: Family Medicine | Admitting: Family Medicine

## 2022-07-26 DIAGNOSIS — R928 Other abnormal and inconclusive findings on diagnostic imaging of breast: Secondary | ICD-10-CM

## 2022-07-30 ENCOUNTER — Other Ambulatory Visit: Payer: Medicare Other

## 2022-08-01 ENCOUNTER — Telehealth: Payer: Self-pay

## 2022-08-01 NOTE — Telephone Encounter (Signed)
Received reminder to repeat MRCP.  Chart reviewed and noted that recommendations were to repeat MRCP if liver function remains elevated.  Please review pt chart  and advise if recommendations are to proceed with MRCP

## 2022-08-01 NOTE — Telephone Encounter (Signed)
-----   Message from Emeline Darling, RN sent at 05/23/2022 11:37 AM EDT ----- Reminder Pt needs repeat MRCP in 1 year.

## 2022-08-02 NOTE — Telephone Encounter (Signed)
Proceed with MRCP with contrast RG

## 2022-08-03 ENCOUNTER — Other Ambulatory Visit: Payer: Self-pay

## 2022-08-03 DIAGNOSIS — K8689 Other specified diseases of pancreas: Secondary | ICD-10-CM

## 2022-08-03 NOTE — Telephone Encounter (Signed)
Pt made aware of recent results and Dr. Chales Abrahams recommendations: Pt MRCP was scheduled for 08/13/2022 at 3:00 PM at [facility]. Pt to arrive at 2:30 PM.  NPO 4 hours prior. Pt made aware. Pt verbalized understanding with all questions answered.

## 2022-08-11 ENCOUNTER — Ambulatory Visit (INDEPENDENT_AMBULATORY_CARE_PROVIDER_SITE_OTHER): Payer: Medicare Other

## 2022-08-11 VITALS — Wt 117.0 lb

## 2022-08-11 DIAGNOSIS — Z0000 Encounter for general adult medical examination without abnormal findings: Secondary | ICD-10-CM | POA: Diagnosis not present

## 2022-08-11 NOTE — Progress Notes (Signed)
Subjective:   Linda Mcmillan is a 72 y.o. female who presents for Medicare Annual (Subsequent) preventive examination.  Visit Complete: Virtual  I connected with  Jari Favre on 08/11/22 by a audio enabled telemedicine application and verified that I am speaking with the correct person using two identifiers.  Patient Location: Home  Provider Location: Office/Clinic  I discussed the limitations of evaluation and management by telemedicine. The patient expressed understanding and agreed to proceed.  Patient Medicare AWV questionnaire was completed by the patient on 08/07/22; I have confirmed that all information answered by patient is correct and no changes since this date.  Review of Systems     Cardiac Risk Factors include: advanced age (>39men, >3 women);dyslipidemia     Objective:    Today's Vitals   08/11/22 1455  Weight: 117 lb (53.1 kg)   Body mass index is 21.57 kg/m.     08/11/2022    3:01 PM 03/14/2022   10:48 AM 08/06/2021    2:24 PM 03/12/2021    8:59 AM  Advanced Directives  Does Patient Have a Medical Advance Directive? Yes Yes Yes Yes  Type of Estate agent of [place];Living will Healthcare Power of [place];Living will;Out of facility DNR (pink MOST or yellow form) Healthcare Power of [employer] of [place];Living will;Out of facility DNR (pink MOST or yellow form)  Copy of Healthcare Power of Attorney in Chart? No - copy requested  No - copy requested     Current Medications (verified) Outpatient Encounter Medications as of 08/11/2022  Medication Sig   clonazePAM (KLONOPIN) 0.5 MG tablet Take 1 tablet (0.5 mg total) by mouth at bedtime.   estradiol (ESTRACE) 0.1 MG/GM vaginal cream 0.5 g intravaginally 3 times per week.   gabapentin (NEURONTIN) 600 MG tablet Take 1 tablet at 6p and 8p.   rOPINIRole (REQUIP) 1 MG tablet Take 1 tablet at 6p   rosuvastatin (CRESTOR) 10 MG tablet Take 1 tablet (10 mg total) by mouth daily.    SYNTHROID 75 MCG tablet TAKE 1 TABLET BY MOUTH DAILY   COVID-19 mRNA vaccine 2023-2024 (COMIRNATY) syringe Inject into the muscle.   No facility-administered encounter medications on file as of 08/11/2022.    Allergies (verified) Sulfa antibiotics   History: Past Medical History:  Diagnosis Date   Anxiety 2018   Arthritis Neck, back (herniated lumbar disc   Atherosclerosis    Cataract Surgery, 2020   Depression 2016   Hyperlipidemia 10 years   Neuromuscular disorder (HCC) Restless leg syndrome, 10 years   Thyroid disease    hypothyroidism   Past Surgical History:  Procedure Laterality Date   ABDOMINAL HYSTERECTOMY  20 years ago   cataract surgery  2020   COLONOSCOPY  09/27/2021   Chales Abrahams   EUS  07/09/2020   [facility]   EYE SURGERY Bilateral    Family History  Problem Relation Age of Onset   Arthritis Mother    Cancer Mother        Colon Cancer at 68 years old   Diabetes Mother    Varicose Veins Mother    Colon cancer Mother    Alcohol abuse Father    Other Father        Heavy Smoker   Varicose Veins Maternal Grandmother    Other Son        PTSD from [place]   ADD / ADHD Son    Alcohol abuse Son    Depression Son  Depression Son    Breast cancer Paternal Aunt    Rectal cancer Neg Hx    Stomach cancer Neg Hx    Pancreatic cancer Neg Hx    Esophageal cancer Neg Hx    Social History   Socioeconomic History   Marital status: Divorced    Spouse name: Not on file   Number of children: 2   Years of education: Not on file   Highest education level: Not on file  Occupational History   Not on file  Tobacco Use   Smoking status: Never   Smokeless tobacco: Never  Vaping Use   Vaping Use: Never used  Substance and Sexual Activity   Alcohol use: Never   Drug use: Never   Sexual activity: Not Currently  Other Topics Concern   Not on file  Social History Narrative   Right Handed    Lives in a one story home   Social  Determinants of Health   Financial Resource Strain: Low Risk  (08/07/2022)   Overall Financial Resource Strain (CARDIA)    Difficulty of Paying Living Expenses: Not hard at all  Food Insecurity: No Food Insecurity (08/07/2022)   Hunger Vital Sign    Worried About Running Out of Food in the Last Year: Never true    Ran Out of Food in the Last Year: Never true  Transportation Needs: No Transportation Needs (08/07/2022)   PRAPARE - Administrator, Civil Service (Medical): No    Lack of Transportation (Non-Medical): No  Physical Activity: Sufficiently Active (08/07/2022)   Exercise Vital Sign    Days of Exercise per Week: 4 days    Minutes of Exercise per Session: 70 min  Stress: No Stress Concern Present (08/07/2022)   [employer] of Occupational Health - Occupational Stress Questionnaire    Feeling of Stress : Only a little  Social Connections: Socially Isolated (08/07/2022)   Social Connection and Isolation Panel [NHANES]    Frequency of Communication with Friends and Family: More than three times a week    Frequency of Social Gatherings with Friends and Family: Twice a week    Attends Religious Services: Never    Database administrator or Organizations: No    Attends Engineer, structural: Never    Marital Status: Divorced    Tobacco Counseling Counseling given: Not Answered   Clinical Intake:  Pre-visit preparation completed: Yes  Pain : No/denies pain     BMI - recorded: 21.57 Nutritional Status: BMI of 19-24  Normal Nutritional Risks: None Diabetes: No  How often do you need to have someone help you when you read instructions, pamphlets, or other written materials from your doctor or pharmacy?: 1 - Never  Interpreter Needed?: No  Information entered by :: Lanier Ensign, LPN   Activities of Daily Living    08/07/2022    9:29 AM  In your present state of health, do you have any difficulty performing the following activities:  Hearing? 0   Vision? 0  Difficulty concentrating or making decisions? 0  Walking or climbing stairs? 0  Dressing or bathing? 0  Doing errands, shopping? 0  Preparing Food and eating ? N  Using the Toilet? N  In the past six months, have you accidently leaked urine? Y  Comment at times  Do you have problems with loss of bowel control? N  Managing your Medications? N  Managing your Finances? N  Housekeeping or managing your Housekeeping? N    Patient  Care Team: Jeani Sow, MD as PCP - General (Family Medicine) Glendale Chard, DO as Consulting Physician (Neurology)  Indicate any recent Medical Services you may have received from other than [facility] providers in the past year (date may be approximate).     Assessment:   This is a routine wellness examination for Norena.  Hearing/Vision screen Hearing Screening - Comments:: Pt denies any hearing issues  Vision Screening - Comments:: Pt follows up with Dr Laney Potash for [facility] exams [facility]   Dietary issues and exercise activities discussed:     Goals Addressed               This Visit's Progress     Patient Stated (pt-stated)        Continue to stay healthy        Depression Screen    08/11/2022    2:59 PM 08/06/2021    2:22 PM 03/09/2021   11:15 AM  PHQ 2/9 Scores  PHQ - 2 Score 1 1 1   PHQ- 9 Score   1    Fall Risk    08/07/2022    9:29 AM 03/14/2022   10:48 AM 08/06/2021    2:25 PM 07/08/2021    9:31 AM 03/12/2021    8:59 AM  Fall Risk   Falls in the past year? 0 1 0 0 0  Number falls in past yr:  0 0 0 0  Injury with Fall?  1 0 0 0  Risk for fall due to : Impaired vision  Impaired vision No Fall Risks   Follow up Falls prevention discussed Falls evaluation completed Falls prevention discussed Falls evaluation completed     MEDICARE RISK AT HOME:   TIMED UP AND GO:  Was the test performed?  No    Cognitive Function:        08/11/2022    3:03 PM 08/06/2021    2:27 PM  6CIT Screen  What Year? 0 points  0 points  What month? 0 points 0 points  What time? 0 points 0 points  Count back from 20 0 points 0 points  Months in reverse 0 points 0 points  Repeat phrase 0 points 0 points  Total Score 0 points 0 points    Immunizations Immunization History  Administered Date(s) Administered   COVID-19, mRNA, vaccine(Comirnaty)12 years and older 12/27/2021   Fluad Quad(high Dose 65+) 11/01/2021   Influenza-Unspecified 11/20/2020   PFIZER(Purple Top)SARS-COV-2 Vaccination 03/12/2019, 04/03/2019, 11/11/2019, 12/08/2020    TDAP status: Due, Education has been provided regarding the importance of this vaccine. Advised may receive this vaccine at local pharmacy or Health Dept. Aware to provide a copy of the vaccination record if obtained from local pharmacy or Health Dept. Verbalized acceptance and understanding.  Flu Vaccine status: Up to date  Pneumococcal vaccine status: Due, Education has been provided regarding the importance of this vaccine. Advised may receive this vaccine at local pharmacy or Health Dept. Aware to provide a copy of the vaccination record if obtained from local pharmacy or Health Dept. Verbalized acceptance and understanding.  Covid-19 vaccine status: Completed vaccines  Qualifies for Shingles Vaccine? Yes   Zostavax completed No   Shingrix Completed?: No.    Education has been provided regarding the importance of this vaccine. Patient has been advised to call insurance company to determine out of pocket expense if they have not yet received this vaccine. Advised may also receive vaccine at local pharmacy or Health Dept. Verbalized acceptance and  understanding.  Screening Tests Health Maintenance  Topic Date Due   DTaP/Tdap/Td (1 - Tdap) Never done   Zoster Vaccines- Shingrix (1 of 2) Never done   Pneumonia Vaccine 2+ Years old (1 of 1 - PCV) Never done   INFLUENZA VACCINE  09/15/2022   Medicare Annual Wellness (AWV)  08/11/2023   MAMMOGRAM  07/20/2024   DEXA SCAN   Completed   COVID-19 Vaccine  Completed   Hepatitis C Screening  Completed   HPV VACCINES  Aged Out   Colonoscopy  Discontinued    Health Maintenance  Health Maintenance Due  Topic Date Due   DTaP/Tdap/Td (1 - Tdap) Never done   Zoster Vaccines- Shingrix (1 of 2) Never done   Pneumonia Vaccine 58+ Years old (1 of 1 - PCV) Never done    Colorectal cancer screening: No longer required.   Mammogram status: Completed 07/21/22. Repeat every year  Bone Density status: Completed 05/27/21. Results reflect: Bone density results: OSTEOPENIA. Repeat every 2 years.   Additional Screening:  Hepatitis C Screening: Completed 11/01/21  Vision Screening: Recommended annual ophthalmology exams for early detection of glaucoma and other disorders of the eye. Is the patient up to date with their annual eye exam?  Yes  Who is the provider or what is the name of the office in which the patient attends annual eye exams? Dr Laney Potash  If pt is not established with a provider, would they like to be referred to a provider to establish care? No .   Dental Screening: Recommended annual dental exams for proper oral hygiene  Community Resource Referral / Chronic Care Management: CRR required this visit?  No   CCM required this visit?  No     Plan:     I have personally reviewed and noted the following in the patient's chart:   Medical and social history Use of alcohol, tobacco or illicit drugs  Current medications and supplements including opioid prescriptions. Patient is not currently taking opioid prescriptions. Functional ability and status Nutritional status Physical activity Advanced directives List of other physicians Hospitalizations, surgeries, and ER visits in previous 12 months Vitals Screenings to include cognitive, depression, and falls Referrals and appointments  In addition, I have reviewed and discussed with patient certain preventive protocols, quality metrics, and best practice  recommendations. A written personalized care plan for preventive services as well as general preventive health recommendations were provided to patient.     Marzella Schlein, LPN   05/23/8117   After Visit Summary: (MyChart) Due to this being a telephonic visit, the after visit summary with patients personalized plan was offered to patient via MyChart   Nurse Notes: none

## 2022-08-11 NOTE — Patient Instructions (Signed)
Linda Mcmillan , Thank you for taking time to come for your Medicare Wellness Visit. I appreciate your ongoing commitment to your health goals. Please review the following plan we discussed and let me know if I can assist you in the future.   These are the goals we discussed:  Goals       Patient Stated      Continue to exercise and increase strength       Patient Stated (pt-stated)      Continue to stay healthy         This is a list of the screening recommended for you and due dates:  Health Maintenance  Topic Date Due   DTaP/Tdap/Td vaccine (1 - Tdap) Never done   Zoster (Shingles) Vaccine (1 of 2) Never done   Pneumonia Vaccine (1 of 1 - PCV) Never done   Flu Shot  09/15/2022   Medicare Annual Wellness Visit  08/11/2023   Mammogram  07/20/2024   DEXA scan (bone density measurement)  Completed   COVID-19 Vaccine  Completed   Hepatitis C Screening  Completed   HPV Vaccine  Aged Out   Colon Cancer Screening  Discontinued    Advanced directives: Please bring a copy of your health care power of attorney and living will to the office at your convenience.  Conditions/risks identified: continue to stay healthy and active   Next appointment: Follow up in one year for your annual wellness visit    Preventive Care 65 Years and Older, Female Preventive care refers to lifestyle choices and visits with your health care provider that can promote health and wellness. What does preventive care include? A yearly physical exam. This is also called an annual well check. Dental exams once or twice a year. Routine eye exams. Ask your health care provider how often you should have your eyes checked. Personal lifestyle choices, including: Daily care of your teeth and gums. Regular physical activity. Eating a healthy diet. Avoiding tobacco and drug use. Limiting alcohol use. Practicing safe sex. Taking low-dose aspirin every day. Taking vitamin and mineral supplements as recommended by your  health care provider. What happens during an annual well check? The services and screenings done by your health care provider during your annual well check will depend on your age, overall health, lifestyle risk factors, and family history of disease. Counseling  Your health care provider may ask you questions about your: Alcohol use. Tobacco use. Drug use. Emotional well-being. Home and relationship well-being. Sexual activity. Eating habits. History of falls. Memory and ability to understand (cognition). Work and work Astronomer. Reproductive health. Screening  You may have the following tests or measurements: Height, weight, and BMI. Blood pressure. Lipid and cholesterol levels. These may be checked every 5 years, or more frequently if you are over 19 years old. Skin check. Lung cancer screening. You may have this screening every year starting at age 41 if you have a 30-pack-year history of smoking and currently smoke or have quit within the past 15 years. Fecal occult blood test (FOBT) of the stool. You may have this test every year starting at age 34. Flexible sigmoidoscopy or colonoscopy. You may have a sigmoidoscopy every 5 years or a colonoscopy every 10 years starting at age 60. Hepatitis C blood test. Hepatitis B blood test. Sexually transmitted disease (STD) testing. Diabetes screening. This is done by checking your blood sugar (glucose) after you have not eaten for a while (fasting). You may have this done every 1-3  years. Bone density scan. This is done to screen for osteoporosis. You may have this done starting at age 37. Mammogram. This may be done every 1-2 years. Talk to your health care provider about how often you should have regular mammograms. Talk with your health care provider about your test results, treatment options, and if necessary, the need for more tests. Vaccines  Your health care provider may recommend certain vaccines, such as: Influenza vaccine.  This is recommended every year. Tetanus, diphtheria, and acellular pertussis (Tdap, Td) vaccine. You may need a Td booster every 10 years. Zoster vaccine. You may need this after age 74. Pneumococcal 13-valent conjugate (PCV13) vaccine. One dose is recommended after age 18. Pneumococcal polysaccharide (PPSV23) vaccine. One dose is recommended after age 51. Talk to your health care provider about which screenings and vaccines you need and how often you need them. This information is not intended to replace advice given to you by your health care provider. Make sure you discuss any questions you have with your health care provider. Document Released: 02/27/2015 Document Revised: 10/21/2015 Document Reviewed: 12/02/2014 Elsevier Interactive Patient Education  2017 [employer].  Fall Prevention in the Home Falls can cause injuries. They can happen to people of all ages. There are many things you can do to make your home safe and to help prevent falls. What can I do on the outside of my home? Regularly fix the edges of walkways and driveways and fix any cracks. Remove anything that might make you trip as you walk through a door, such as a raised step or threshold. Trim any bushes or trees on the path to your home. Use bright outdoor lighting. Clear any walking paths of anything that might make someone trip, such as rocks or tools. Regularly check to see if handrails are loose or broken. Make sure that both sides of any steps have handrails. Any raised decks and porches should have guardrails on the edges. Have any leaves, snow, or ice cleared regularly. Use sand or salt on walking paths during winter. Clean up any spills in your garage right away. This includes oil or grease spills. What can I do in the bathroom? Use night lights. Install grab bars by the toilet and in the tub and shower. Do not use towel bars as grab bars. Use non-skid mats or decals in the tub or shower. If you need to sit  down in the shower, use a plastic, non-slip stool. Keep the floor dry. Clean up any water that spills on the floor as soon as it happens. Remove soap buildup in the tub or shower regularly. Attach bath mats securely with double-sided non-slip rug tape. Do not have throw rugs and other things on the floor that can make you trip. What can I do in the bedroom? Use night lights. Make sure that you have a light by your bed that is easy to reach. Do not use any sheets or blankets that are too big for your bed. They should not hang down onto the floor. Have a firm chair that has side arms. You can use this for support while you get dressed. Do not have throw rugs and other things on the floor that can make you trip. What can I do in the kitchen? Clean up any spills right away. Avoid walking on wet floors. Keep items that you use a lot in easy-to-reach places. If you need to reach something above you, use a strong step stool that has a grab  bar. Keep electrical cords out of the way. Do not use floor polish or wax that makes floors slippery. If you must use wax, use non-skid floor wax. Do not have throw rugs and other things on the floor that can make you trip. What can I do with my stairs? Do not leave any items on the stairs. Make sure that there are handrails on both sides of the stairs and use them. Fix handrails that are broken or loose. Make sure that handrails are as long as the stairways. Check any carpeting to make sure that it is firmly attached to the stairs. Fix any carpet that is loose or worn. Avoid having throw rugs at the top or bottom of the stairs. If you do have throw rugs, attach them to the floor with carpet tape. Make sure that you have a light switch at the top of the stairs and the bottom of the stairs. If you do not have them, ask someone to add them for you. What else can I do to help prevent falls? Wear shoes that: Do not have high heels. Have rubber bottoms. Are  comfortable and fit you well. Are closed at the toe. Do not wear sandals. If you use a stepladder: Make sure that it is fully opened. Do not climb a closed stepladder. Make sure that both sides of the stepladder are locked into place. Ask someone to hold it for you, if possible. Clearly mark and make sure that you can see: Any grab bars or handrails. First and last steps. Where the edge of each step is. Use tools that help you move around (mobility aids) if they are needed. These include: Canes. Walkers. Scooters. Crutches. Turn on the lights when you go into a dark area. Replace any light bulbs as soon as they burn out. Set up your furniture so you have a clear path. Avoid moving your furniture around. If any of your floors are uneven, fix them. If there are any pets around you, be aware of where they are. Review your medicines with your doctor. Some medicines can make you feel dizzy. This can increase your chance of falling. Ask your doctor what other things that you can do to help prevent falls. This information is not intended to replace advice given to you by your health care provider. Make sure you discuss any questions you have with your health care provider. Document Released: 11/27/2008 Document Revised: 07/09/2015 Document Reviewed: 03/07/2014 Elsevier Interactive Patient Education  2017 [employer].

## 2022-08-13 ENCOUNTER — Ambulatory Visit (HOSPITAL_COMMUNITY)
Admission: RE | Admit: 2022-08-13 | Discharge: 2022-08-13 | Disposition: A | Discharge status: Home / Self Care | Payer: Medicare Other | Source: Ambulatory Visit | Attending: Gastroenterology | Admitting: Gastroenterology

## 2022-08-13 ENCOUNTER — Other Ambulatory Visit: Payer: Self-pay | Admitting: Gastroenterology

## 2022-08-13 DIAGNOSIS — K8689 Other specified diseases of pancreas: Secondary | ICD-10-CM | POA: Diagnosis present

## 2022-08-13 MED ORDER — GADOBUTROL 1 MMOL/ML IV SOLN
5.0000 mL | Freq: Once | INTRAVENOUS | Status: AC | PRN
  Administered 2022-08-13: 5 mL via INTRAVENOUS

## 2022-08-16 NOTE — Telephone Encounter (Signed)
Inbound call from patient in regards to results. Please advise.

## 2022-08-16 NOTE — Telephone Encounter (Signed)
Pt calling in for recent results from recent image. Pt was notified that the test has not been read by radiology and that as soon as test has been read by radiology and posted the Dr. Chales Abrahams will review and make any recommendations if needed and we will contact the pt. Pt verbalized understanding with all questions answered.

## 2022-08-21 NOTE — Progress Notes (Signed)
Please inform the patient. MRI/MRCP with good results.  No obvious masses.  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  If they become abnormal, will perform further workup. Send report to family physician

## 2022-09-26 ENCOUNTER — Encounter: Payer: Self-pay | Admitting: Neurology

## 2022-09-26 ENCOUNTER — Other Ambulatory Visit: Payer: Self-pay | Admitting: Neurology

## 2022-09-26 DIAGNOSIS — G2581 Restless legs syndrome: Secondary | ICD-10-CM

## 2022-09-27 MED ORDER — CLONAZEPAM 0.5 MG PO TABS: 0.5000 mg | ORAL_TABLET | Freq: Every day | ORAL | 1 refills | Status: DC

## 2022-09-27 MED ORDER — ROPINIROLE HCL 1 MG PO TABS: ORAL_TABLET | ORAL | 1 refills | Status: DC

## 2022-09-27 NOTE — Telephone Encounter (Signed)
Called [facility] to check on this and Pharmacy is currently closed. Will check back

## 2022-10-31 ENCOUNTER — Other Ambulatory Visit: Payer: Self-pay | Admitting: Family

## 2022-10-31 ENCOUNTER — Encounter: Payer: Self-pay | Admitting: Family Medicine

## 2022-10-31 ENCOUNTER — Telehealth: Payer: Self-pay | Admitting: Family Medicine

## 2022-10-31 MED ORDER — SYNTHROID 75 MCG PO TABS: ORAL_TABLET | ORAL | 0 refills | Status: DC

## 2022-10-31 NOTE — Telephone Encounter (Signed)
Pt has appt in October but needs meds asap.   Prescription Request  10/31/2022  LOV: 11/01/2021  What is the name of the medication or equipment?  SYNTHROID 75 MCG tablet   Have you contacted your pharmacy to request a refill? Yes   Which pharmacy would you like this sent to?  [facility] [id number] [place], [place] - [address] [place] [address] Phone: [phone] Fax: [phone]    Patient notified that their request is being sent to the clinical staff for review and that they should receive a response within 2 business days.   Please advise at Mobile [phone] (mobile)

## 2022-11-01 NOTE — Telephone Encounter (Signed)
Pt called back and she states she needs more that a 30 day supply because she will be out before her appt time. Please send 60 days more for her to have enough until her appt.    SYNTHROID

## 2022-11-02 ENCOUNTER — Other Ambulatory Visit: Payer: Self-pay | Admitting: Family

## 2022-11-02 MED ORDER — SYNTHROID 75 MCG PO TABS: ORAL_TABLET | ORAL | 0 refills | Status: DC

## 2022-11-08 ENCOUNTER — Telehealth: Payer: Self-pay | Admitting: Family Medicine

## 2022-11-08 NOTE — Telephone Encounter (Signed)
Patient called in requesting to know when her last RSV vaccine was completed, if we are able to find this information. States she needed to know if she's had it and when since she's going on a trip and wants to make sure she's protected.   I advised pt in the meantime to look at her mychart and check with local pharmacies to see if they have a record of her getting  this vaccine. Pt verbalized understanding.

## 2022-11-09 NOTE — Telephone Encounter (Signed)
Called and informed patient of message below. Patient thanked [place] and states she took my advice in calling pharmacy to check status of this and was informed that she had this vaccine administered last year.

## 2022-11-09 NOTE — Telephone Encounter (Signed)
RSV updated in chart from [facility] record.

## 2022-11-20 ENCOUNTER — Encounter: Payer: Self-pay | Admitting: Family Medicine

## 2022-11-21 ENCOUNTER — Other Ambulatory Visit: Payer: Self-pay | Admitting: *Deleted

## 2022-11-21 DIAGNOSIS — Z780 Asymptomatic menopausal state: Secondary | ICD-10-CM

## 2022-11-21 MED ORDER — ESTRADIOL 0.1 MG/GM VA CREA
TOPICAL_CREAM | VAGINAL | 2 refills | Status: DC
Start: 2022-11-21 — End: 2023-07-10

## 2022-11-22 ENCOUNTER — Telehealth: Payer: Self-pay

## 2022-11-22 ENCOUNTER — Other Ambulatory Visit: Payer: Self-pay

## 2022-11-22 DIAGNOSIS — K760 Fatty (change of) liver, not elsewhere classified: Secondary | ICD-10-CM

## 2022-11-22 NOTE — Telephone Encounter (Signed)
Reminder was received in Epic.  Pt made aware. Orders placed in Epic. Pt notified to come the beginning of November.  Pt verbalized understanding with all questions answered.

## 2022-11-22 NOTE — Telephone Encounter (Signed)
-----   Message from Nurse Joselyn Glassman sent at 08/22/2022 12:07 PM EDT ----- Personal reminder placed on 08/22/2022: Lets recheck LFTs in 3 months, then Q64monthly and PRN thereafter. If they become abnormal, will perform further workup.   Recommendations from recent MRI

## 2022-12-14 ENCOUNTER — Ambulatory Visit: Payer: Medicare Other | Admitting: Family Medicine

## 2022-12-14 ENCOUNTER — Encounter: Payer: Self-pay | Admitting: Family Medicine

## 2022-12-14 VITALS — BP 89/55 | HR 82 | Temp 98.3°F | Resp 16 | Ht 61.75 in | Wt 116.0 lb

## 2022-12-14 DIAGNOSIS — R7303 Prediabetes: Secondary | ICD-10-CM

## 2022-12-14 DIAGNOSIS — E782 Mixed hyperlipidemia: Secondary | ICD-10-CM | POA: Diagnosis not present

## 2022-12-14 DIAGNOSIS — Z780 Asymptomatic menopausal state: Secondary | ICD-10-CM

## 2022-12-14 DIAGNOSIS — E039 Hypothyroidism, unspecified: Secondary | ICD-10-CM

## 2022-12-14 DIAGNOSIS — J069 Acute upper respiratory infection, unspecified: Secondary | ICD-10-CM | POA: Diagnosis not present

## 2022-12-14 DIAGNOSIS — I700 Atherosclerosis of aorta: Secondary | ICD-10-CM

## 2022-12-14 LAB — COMPREHENSIVE METABOLIC PANEL
ALT: 20 U/L (ref 0–35)
AST: 27 U/L (ref 0–37)
Albumin: 4.1 g/dL (ref 3.5–5.2)
Alkaline Phosphatase: 45 U/L (ref 39–117)
BUN: 19 mg/dL (ref 6–23)
CO2: 30 meq/L (ref 19–32)
Calcium: 9.5 mg/dL (ref 8.4–10.5)
Chloride: 102 meq/L (ref 96–112)
Creatinine, Ser: 0.66 mg/dL (ref 0.40–1.20)
GFR: 86.35 mL/min (ref >60.00)
Glucose, Bld: 87 mg/dL (ref 70–99)
Potassium: 4.2 meq/L (ref 3.5–5.1)
Sodium: 138 meq/L (ref 135–145)
Total Bilirubin: 0.4 mg/dL (ref 0.2–1.2)
Total Protein: 7 g/dL (ref 6.0–8.3)

## 2022-12-14 LAB — LIPID PANEL
Cholesterol: 114 mg/dL (ref 0–200)
HDL: 51.9 mg/dL (ref >39.00)
LDL Cholesterol: 45 mg/dL (ref 0–99)
NonHDL: 61.86
Total CHOL/HDL Ratio: 2
Triglycerides: 86 mg/dL (ref 0.0–149.0)
VLDL: 17.2 mg/dL (ref 0.0–40.0)

## 2022-12-14 LAB — CBC WITH DIFFERENTIAL/PLATELET
Basophils Absolute: 0.1 10*3/uL (ref 0.0–0.1)
Basophils Relative: 1.2 % (ref 0.0–3.0)
Eosinophils Absolute: 0.3 10*3/uL (ref 0.0–0.7)
Eosinophils Relative: 5.9 % — ABNORMAL HIGH (ref 0.0–5.0)
HCT: 41.1 % (ref 36.0–46.0)
Hemoglobin: 13.5 g/dL (ref 12.0–15.0)
Lymphocytes Relative: 28.9 % (ref 12.0–46.0)
Lymphs Abs: 1.4 10*3/uL (ref 0.7–4.0)
MCHC: 32.7 g/dL (ref 30.0–36.0)
MCV: 93.5 fL (ref 78.0–100.0)
Monocytes Absolute: 0.6 10*3/uL (ref 0.1–1.0)
Monocytes Relative: 12.2 % — ABNORMAL HIGH (ref 3.0–12.0)
Neutro Abs: 2.4 10*3/uL (ref 1.4–7.7)
Neutrophils Relative %: 51.8 % (ref 43.0–77.0)
Platelets: 254 10*3/uL (ref 150.0–400.0)
RBC: 4.4 Mil/uL (ref 3.87–5.11)
RDW: 13.6 % (ref 11.5–15.5)
WBC: 4.7 10*3/uL (ref 4.0–10.5)

## 2022-12-14 LAB — HEMOGLOBIN A1C: Hgb A1c MFr Bld: 5.8 % (ref 4.6–6.5)

## 2022-12-14 LAB — POC COVID19 BINAXNOW: SARS Coronavirus 2 Ag: NEGATIVE

## 2022-12-14 LAB — TSH: TSH: 1.02 u[IU]/mL (ref 0.35–5.50)

## 2022-12-14 MED ORDER — ROSUVASTATIN CALCIUM 10 MG PO TABS: 10.0000 mg | ORAL_TABLET | Freq: Every day | ORAL | 3 refills | Status: DC

## 2022-12-14 MED ORDER — SYNTHROID 75 MCG PO TABS: ORAL_TABLET | ORAL | 3 refills | Status: DC

## 2022-12-14 NOTE — Progress Notes (Signed)
Subjective:    Patient ID: Linda Mcmillan, female    DOB: 01/03/1951, 72 y.o.   MRN: [id number]  Chief Complaint  Patient presents with   Annual Exam    Annual Exam Not fasting   HPI Hypothyroidism- taking synthroid 75 mcg daily. Doing well. No issues.   RLS - On requip 1 mg, gabapentin, and clonazepam-followed by neuro.  Working well.    HLD- Taking Crestor 10mg .  Card.  Working on diet. Responding well.   Pre DM - A1C has been on border-working on it. Has improved her diet and exercise, cutting back on sugar and exercising 4x a week.   Vaginal Dryness - Compliant with 0.1 mg estrace vaginal cream. Denies any vaginal infections.   Cough - Reports she has been coughing following her return from vacation.  Health Maintenance Due  Topic Date Due   DTaP/Tdap/Td (1 - Tdap) Never done    Past Medical History:  Diagnosis Date   Anxiety 2018   Arthritis Neck, back (herniated lumbar disc   Atherosclerosis    Cataract Surgery, 2020   Depression 2016   Hyperlipidemia 10 years   Neuromuscular disorder (HCC) Restless leg syndrome, 10 years   Thyroid disease    hypothyroidism    Past Surgical History:  Procedure Laterality Date   ABDOMINAL HYSTERECTOMY  20 years ago   cataract surgery  2020   COLONOSCOPY  09/27/2021   Chales Abrahams   EUS  07/09/2020   [facility]   EYE SURGERY Bilateral    RHYTIDECTOMY NECK / CHEEK / CHIN       Current Outpatient Medications:    clonazePAM (KLONOPIN) 0.5 MG tablet, Take 1 tablet (0.5 mg total) by mouth at bedtime., Disp: 90 tablet, Rfl: 1   COVID-19 mRNA vaccine 2023-2024 (COMIRNATY) syringe, Inject into the muscle., Disp: 0.3 mL, Rfl: 0   estradiol (ESTRACE) 0.1 MG/GM vaginal cream, 0.5 g intravaginally 3 times per week., Disp: 42.5 g, Rfl: 2   gabapentin (NEURONTIN) 600 MG tablet, Take 1 tablet at 6p and 8p., Disp: 180 tablet, Rfl: 3   rOPINIRole (REQUIP) 1 MG tablet, Take 1 tablet at 6p, Disp: 90 tablet, Rfl: 1    rosuvastatin (CRESTOR) 10 MG tablet, Take 1 tablet (10 mg total) by mouth daily., Disp: 90 tablet, Rfl: 3   SYNTHROID 75 MCG tablet, TAKE 1 TABLET BY MOUTH DAILY, Disp: 90 tablet, Rfl: 3  Allergies  Allergen Reactions   Sulfa Antibiotics Nausea Only and Rash    Other reaction(s): Headache   ROS neg/noncontributory except as noted HPI/below    Objective:    BP (!) 89/55   Pulse 82   Temp 98.3 F (36.8 C) (Temporal)   Resp 16   Ht 5' 1.75" (1.568 m)   Wt 116 lb (52.6 kg)   SpO2 97%   BMI 21.39 kg/m  Wt Readings from Last 3 Encounters:  12/14/22 116 lb (52.6 kg)  08/11/22 117 lb (53.1 kg)  03/14/22 117 lb (53.1 kg)    Physical Exam   Gen: WDWN NAD HEENT: NCAT, conjunctiva not injected, sclera nonicteric NECK:  supple, no thyromegaly, no nodes, no carotid bruits CARDIAC: RRR, S1S2+, no murmur. DP 2+B LUNGS: CTAB. No wheezes ABDOMEN:  BS+, soft, NTND, No HSM, no masses EXT:  no edema MSK: no gross abnormalities.  NEURO: A&O x3.  CN II-XII intact.  PSYCH: normal mood. Good eye contact  Results for orders placed or performed in visit on 12/14/22  POC COVID-19  Result Value Ref Range   SARS Coronavirus 2 Ag Negative Negative       Assessment & Plan:  Acquired hypothyroidism -     Comprehensive metabolic panel -     TSH -     Synthroid; TAKE 1 TABLET BY MOUTH DAILY  Dispense: 90 tablet; Refill: 3  Mixed hyperlipidemia -     Comprehensive metabolic panel -     Lipid panel -     Rosuvastatin Calcium; Take 1 tablet (10 mg total) by mouth daily.  Dispense: 90 tablet; Refill: 3 -     Ambulatory referral to Cardiology  Prediabetes -     Comprehensive metabolic panel -     Hemoglobin A1c  Postmenopausal estrogen deficiency  Atherosclerosis of aorta (HCC) -     Rosuvastatin Calcium; Take 1 tablet (10 mg total) by mouth daily.  Dispense: 90 tablet; Refill: 3 -     Ambulatory referral to Cardiology  Viral upper respiratory tract infection -     CBC with  Differential/Platelet -     POC COVID-19 BinaxNow  URI-covid neg.  Symptomatic tx.  Monitor for now.  If changes, let us know Return in about 1 year (around 12/14/2023) for cholesterol.  Germaine Pomfret Rice,acting as a scribe for Angelena Sole, MD.,have documented all relevant documentation on the behalf of Angelena Sole, MD,as directed by  Angelena Sole, MD while in the presence of Angelena Sole, MD.  I, Angelena Sole, MD, have reviewed all documentation for this visit. The documentation on 12/14/22 for the exam, diagnosis, procedures, and orders are all accurate and complete.   Angelena Sole, MD

## 2022-12-14 NOTE — Assessment & Plan Note (Signed)
Chronic.  Doing well on vaginal estrogen

## 2022-12-14 NOTE — Assessment & Plan Note (Signed)
Chronic.  Controlled.  Working on diet/exercise

## 2022-12-14 NOTE — Assessment & Plan Note (Signed)
Chronic  controlled.  Cont crestor 5mg  daily

## 2022-12-14 NOTE — Patient Instructions (Addendum)
It was very nice to see you today!  Get Tdap at pharmacy  PLEASE NOTE:  If you had any lab tests please let us know if you have not heard back within a few days. You may see your results on MyChart before we have a chance to review them but we will give you a call once they are reviewed by us. If we ordered any referrals today, please let us know if you have not heard from their office within the next week.   Please try these tips to maintain a healthy lifestyle:  Eat most of your calories during the day when you are active. Eliminate processed foods including packaged sweets (pies, cakes, cookies), reduce intake of potatoes, white bread, white pasta, and white rice. Look for whole grain options, oat flour or almond flour.  Each meal should contain half fruits/vegetables, one quarter protein, and one quarter carbs (no bigger than a computer mouse).  Cut down on sweet beverages. This includes juice, soda, and sweet tea. Also watch fruit intake, though this is a healthier sweet option, it still contains natural sugar! Limit to 3 servings daily.  Drink at least 1 glass of water with each meal and aim for at least 8 glasses per day  Exercise at least 150 minutes every week.   

## 2022-12-14 NOTE — Assessment & Plan Note (Signed)
Chronic  pt requesting referral to Card as used to followed regularly in [place] and wants addn'l testing.  Taking statin

## 2022-12-14 NOTE — Assessment & Plan Note (Signed)
Chronic.  Controlled.  Continue synthroid branded 0.075mg  daily

## 2022-12-16 ENCOUNTER — Encounter: Payer: Self-pay | Admitting: Family Medicine

## 2022-12-16 ENCOUNTER — Telehealth: Payer: Self-pay | Admitting: Cardiology

## 2022-12-16 NOTE — Telephone Encounter (Signed)
Pt is requested to switch from Dr Swaziland to Dr Cristal Deer

## 2022-12-16 NOTE — Progress Notes (Signed)
Labs look great except of course A1C prediabetes-but better.  Continue working on diet/exercise

## 2022-12-19 ENCOUNTER — Telehealth: Payer: Self-pay | Admitting: Gastroenterology

## 2022-12-19 NOTE — Telephone Encounter (Signed)
Inbound call from patient, would like to discuss liver enzyme labs that needed to be done in October. She states "it is a long story" and would like to discuss with Viviann Spare.

## 2022-12-20 NOTE — Telephone Encounter (Signed)
Would recommend checking LFTs, lipase periodically-like every 6 months x 2 yrs Do not need future MRI if all labs are Nl (now and in future) RG

## 2022-12-20 NOTE — Telephone Encounter (Signed)
Pt stated that she recently had her labs done at her PCP office and wanted to make Dr. Chales Abrahams aware of her liver enzymes that were normal. Results are in Epic.  Pt was being checked periodically every three months and had a repeat MRI back in June of 2024. Pt is questioning if she needs to have any more repeat labs in the future or a future MRI. Please review and advise

## 2022-12-21 NOTE — Telephone Encounter (Signed)
Pt made aware of Dr. Chales Abrahams recommendations: Reminder placed in Epic to repeat labs in 6 months. Pt made aware. Pt verbalized understanding with all questions answered.

## 2023-03-09 ENCOUNTER — Encounter: Payer: Self-pay | Admitting: Family Medicine

## 2023-03-10 ENCOUNTER — Encounter (HOSPITAL_BASED_OUTPATIENT_CLINIC_OR_DEPARTMENT_OTHER): Payer: Self-pay

## 2023-03-10 NOTE — Telephone Encounter (Signed)
noted

## 2023-03-15 ENCOUNTER — Encounter (HOSPITAL_BASED_OUTPATIENT_CLINIC_OR_DEPARTMENT_OTHER): Payer: Self-pay | Admitting: Cardiology

## 2023-03-15 ENCOUNTER — Ambulatory Visit (HOSPITAL_BASED_OUTPATIENT_CLINIC_OR_DEPARTMENT_OTHER): Payer: Medicare Other | Admitting: Cardiology

## 2023-03-15 VITALS — BP 112/64 | HR 76 | Ht 61.75 in | Wt 120.6 lb

## 2023-03-15 DIAGNOSIS — E7800 Pure hypercholesterolemia, unspecified: Secondary | ICD-10-CM | POA: Diagnosis not present

## 2023-03-15 DIAGNOSIS — I700 Atherosclerosis of aorta: Secondary | ICD-10-CM | POA: Diagnosis not present

## 2023-03-15 DIAGNOSIS — Z712 Person consulting for explanation of examination or test findings: Secondary | ICD-10-CM

## 2023-03-15 DIAGNOSIS — Z7189 Other specified counseling: Secondary | ICD-10-CM | POA: Diagnosis not present

## 2023-03-15 NOTE — Progress Notes (Unsigned)
Cardiology Office Note:  .   Date:  03/15/2023  ID:  [name], DOB [birth year], MRN [id number] PCP: Jeani Sow, MD  [facility] Providers Cardiologist:  None {  History of Present Illness: .   Linda Mcmillan is a 73 y.o. female with PMH aortic atherosclerosis, hyperlipidemia, RBBB. She was previously seen by Dr. Swaziland and established care with me on 03/15/23.  CV history: see summary of prior external tests in Dr. Elvis Coil note from 04/13/2021. In summary, echo 2022 with EF 63%, mild TR. ETT 2022 13.4 METs, no angina, no ischemia.  Today: Had a cardiologist in [place], then saw Dr. Swaziland when she moved here in 2023. Started with cardiology at the recommendation of her PCP for general management/prevention. Told she had aortic arch atherosclerosis and hyperlipidemia.  Has gotten yearly reports from [facility], reviewed today.  Excellent lifestyle. Goes to the gym, eats very healthy. Reviewed her lipid profile pattern, noting that even on constant dose of 10 mg rosuvastatin, TG has improved, as has LDL, with diet change  ROS: Denies chest pain, shortness of breath at rest or with normal exertion. No PND, orthopnea, LE edema or unexpected weight gain. No syncope or palpitations. ROS otherwise negative except as noted.   Studies Reviewed: Marland Kitchen    EKG:       Physical Exam:   VS:  BP 112/64   Pulse 76   Ht 5' 1.75" (1.568 m)   Wt 120 lb 9.6 oz (54.7 kg)   SpO2 97%   BMI 22.24 kg/m    Wt Readings from Last 3 Encounters:  03/15/23 120 lb 9.6 oz (54.7 kg)  12/14/22 116 lb (52.6 kg)  08/11/22 117 lb (53.1 kg)    GEN: Well nourished, well developed in no acute distress HEENT: Normal, moist mucous membranes NECK: No JVD CARDIAC: regular rhythm, normal S1 and S2, no rubs or gallops. No murmur. VASCULAR: Radial and DP pulses 2+ bilaterally. No carotid bruits RESPIRATORY:  Clear to auscultation without rales, wheezing or rhonchi  ABDOMEN: Soft, non-tender,  non-distended MUSCULOSKELETAL:  Ambulates independently SKIN: Warm and dry, no edema NEUROLOGIC:  Alert and oriented x 3. No focal neuro deficits noted. PSYCHIATRIC:  Normal affect    ASSESSMENT AND PLAN: .    Aortic atherosclerosis Hyperlipidemia -had extensive scientific conversation on plaque, lipids, statins, inflammation, monitoring, review of her prior extensive labs  CV risk counseling and prevention -recommend heart healthy/Mediterranean diet, with whole grains, fruits, vegetable, fish, lean meats, nuts, and olive oil. Limit salt. -recommend moderate walking, 3-5 times/week for 30-50 minutes each session. Aim for at least 150 minutes.week. Goal should be pace of 3 miles/hours, or walking 1.5 miles in 30 minutes -recommend avoidance of tobacco products. Avoid excess alcohol. -ASCVD risk score: The ASCVD Risk score ([name] DK, et al., 2019) failed to calculate for the following reasons:   The valid total cholesterol range is 130 to 320 mg/dL    Dispo: 9 mos   Total time of encounter: I spent *** minutes dedicated to the care of this patient on the date of this encounter to include pre-visit review of records, face-to-face time with the patient discussing conditions above, and clinical documentation with the electronic health record. We specifically spent time today discussing ***   Signed, Jodelle Red, MD   Jodelle Red, MD, PhD, [facility]  [facility]  [place]  [facility] V[facility] at [facility] at [facility] [address], [address] [place], [place]  [address] [phone]   

## 2023-03-20 ENCOUNTER — Encounter: Payer: Self-pay | Admitting: Neurology

## 2023-03-20 ENCOUNTER — Ambulatory Visit (INDEPENDENT_AMBULATORY_CARE_PROVIDER_SITE_OTHER): Payer: Medicare Other | Admitting: Neurology

## 2023-03-20 DIAGNOSIS — G2581 Restless legs syndrome: Secondary | ICD-10-CM

## 2023-03-20 MED ORDER — GABAPENTIN 600 MG PO TABS: ORAL_TABLET | ORAL | 3 refills | Status: DC

## 2023-03-20 MED ORDER — CLONAZEPAM 0.5 MG PO TABS: 0.5000 mg | ORAL_TABLET | Freq: Every day | ORAL | 1 refills | Status: DC

## 2023-03-20 MED ORDER — ROPINIROLE HCL 1 MG PO TABS: ORAL_TABLET | ORAL | 3 refills | Status: DC

## 2023-03-20 NOTE — Progress Notes (Signed)
Follow-up Visit   Date: 03/20/2023    Linda Mcmillan MRN: [id number] DOB: [birth year]    Linda Mcmillan is a 73 y.o. right-handed Caucasian female with hypertension and hyperlipidemia returning to the clinic for follow-up of restless leg syndrome.  The patient was accompanied to the clinic by self.   IMPRESSION/PLAN: Restless leg syndrome, controlled  - Previously tried:  pramipexole, oxycodone - Continue gabapentin 600mg  at 6:30p and 10:30p - Continue ropinirole 1mg  at 6:30p - Continue clonazepam 0.5mg  at bedtime  2.   Facial numbness following cosmetic surgery.   - Reassurance provided   Return to clinic in 1 year.  --------------------------------------------- History of present illness: For the last 12 years, she has restless sensation of the feet.  Symptoms are worse at rest and improved with activity. She has previously tried: pramipexole, oxycodone and was followed at [facility] in 2016. After moving to [place], [place] she established care with Dr. Clent Ridges at [facility] and more recently followed by Dr. Clemetine Marker.  For the past two years, she has been on gabapentin 600mg  at 6p and 10p, ropinirole 1mg  at 6p, and clonazepam 0.5mg  at bedtime.  Most of the time (95%), her RLS is well-controlled on this regimen. She rarely needs to get up at night to walk around.  She moved to [place] from [place], [place] in December and would like to establish care.  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 She has been retired since 2011. She lives alone.    UPDATE 03/14/2022:  She is here for follow-up visit.  Overall, restless leg is well-controlled on her current regimen.  Occasionally in the evenings, she needs to stretch the legs both she is able to sleep well through the night.  She has many other complains including a bump on her head following blunt injury to her head after hitting the forehead on her  mantle, increased fullness of the veins over her temple region, right ganglion cyst / wrist pain, episodic cramping of the left foot.  She googles her symptoms a lot online and has many questions.    UPDATE 03/20/2023:  She is here for follow-up visit.  Her restless leg syndrome is very well-controlled on her current regimen.  She is needing refills. She is planning a trip to [place] in May and thinking of moving back to [place].  She has a lot of personal stressors involving the health of her two sons. She also had a chin lift several months ago and reports ongoing numbness of the face. Symptoms are slowly improving.   Medications:  Current Outpatient Medications on File Prior to Visit  Medication Sig Dispense Refill   estradiol (ESTRACE) 0.1 MG/GM vaginal cream 0.5 g intravaginally 3 times per week. 42.5 g 2   rosuvastatin (CRESTOR) 10 MG tablet Take 1 tablet (10 mg total) by mouth daily. 90 tablet 3   SYNTHROID 75 MCG tablet TAKE 1 TABLET BY MOUTH DAILY 90 tablet 3   No current facility-administered medications on file prior to visit.    Allergies:  Allergies  Allergen Reactions   Hydrocodone Nausea And Vomiting and Nausea Only   Sulfa Antibiotics Nausea Only and Rash    Other reaction(s): Headache    Vital Signs:  BP 115/62   Pulse 72   Ht 5' 1.75" (1.568 m)   Wt 121 lb (54.9 kg)   SpO2 97%   BMI 22.31 kg/m    Neurological Exam: MENTAL  STATUS including orientation to time, place, person, recent and remote memory, attention span and concentration, language, and fund of knowledge is normal.  Speech is not dysarthric.  CRANIAL NERVES:  Pupils equal round and reactive to light.  Normal conjugate, extra-ocular eye movements in all directions of gaze.  No ptosis.  Face is symmetric. Facial sensation is intact. Palate elevates symmetrically.  Tongue is midline.    MOTOR:  Motor strength is 5/5 in all extremities.  No atrophy, fasciculations or abnormal movements.  No pronator drift.   Tone is normal.    MSRs:  Reflexes are 2+/4 throughout .  SENSORY:  Intact to vibration throughout .  COORDINATION/GAIT:  Normal finger-to- nose-finger.  Intact rapid alternating movements bilaterally.  Gait narrow based and stable.   Data: n/a     Thank you for allowing me to participate in patient's care.  If I can answer any additional questions, I would be pleased to do so.    Sincerely,    Neeta Storey K. Allena Katz, DO

## 2023-04-18 ENCOUNTER — Ambulatory Visit (INDEPENDENT_AMBULATORY_CARE_PROVIDER_SITE_OTHER): Admitting: Family Medicine

## 2023-04-18 VITALS — BP 105/65 | HR 71 | Temp 98.2°F | Resp 18 | Ht 61.75 in | Wt 119.2 lb

## 2023-04-18 DIAGNOSIS — M25561 Pain in right knee: Secondary | ICD-10-CM | POA: Diagnosis not present

## 2023-04-18 DIAGNOSIS — G8929 Other chronic pain: Secondary | ICD-10-CM

## 2023-04-18 DIAGNOSIS — R300 Dysuria: Secondary | ICD-10-CM | POA: Diagnosis not present

## 2023-04-18 LAB — POC URINALSYSI DIPSTICK (AUTOMATED)
Bilirubin, UA: NEGATIVE
Blood, UA: NEGATIVE
Glucose, UA: NEGATIVE
Ketones, UA: NEGATIVE
Leukocytes, UA: NEGATIVE
Nitrite, UA: POSITIVE
Protein, UA: NEGATIVE
Spec Grav, UA: 1.015 (ref 1.010–1.025)
Urobilinogen, UA: 0.2 U/dL
pH, UA: 6.5 (ref 5.0–8.0)

## 2023-04-18 NOTE — Patient Instructions (Signed)
 It was very nice to see you today!  [facility] at [facility]  [address] on the 1st floor Phone number [phone]    PLEASE NOTE:  If you had any lab tests please let us know if you have not heard back within a few days. You may see your results on MyChart before we have a chance to review them but we will give you a call once they are reviewed by [place]. If we ordered any referrals today, please let us know if you have not heard from their office within the next week.   Please try these tips to maintain a healthy lifestyle:  Eat most of your calories during the day when you are active. Eliminate processed foods including packaged sweets (pies, cakes, cookies), reduce intake of potatoes, white bread, white pasta, and white rice. Look for whole grain options, oat flour or almond flour.  Each meal should contain half fruits/vegetables, one quarter protein, and one quarter carbs (no bigger than a computer mouse).  Cut down on sweet beverages. This includes juice, soda, and sweet tea. Also watch fruit intake, though this is a healthier sweet option, it still contains natural sugar! Limit to 3 servings daily.  Drink at least 1 glass of water with each meal and aim for at least 8 glasses per day  Exercise at least 150 minutes every week.

## 2023-04-18 NOTE — Progress Notes (Signed)
 Subjective:     Patient ID: Linda Mcmillan, female    DOB: 1950-06-14, 73 y.o.   MRN: [id number]  Chief Complaint  Patient presents with   Knee Pain    Discuss referral to pain management due to right knee pain and popping   Burning with urination    Possible bladder infection, feeling like she need to go, but not a whole lot of urine comes out    HPI Discussed the use of AI scribe software for clinical note transcription with the patient, who gave verbal consent to proceed.  History of Present Illness   Linda Mcmillan is a 73 year old female who presents with urinary symptoms and right knee discomfort.  For approximately one month, she has experienced urinary symptoms characterized by dysuria and urinary frequency with minimal output. These symptoms are persistent and atypical compared to her previous urinary tract infections, as they have not worsened over time. She denies abdominal pain or fever. She has not used any antibiotics recently, except for a temporary antibiotic eye drop following eye surgery in November. Her medical history includes bladder surgery in her late forties to repair a cystocele, which has functioned well since. She is currently using estradiol cream for urinary health.  She reports right knee discomfort that began about a month ago, described as an ache on the medial side of the knee, with occasional popping but no associated pain. She recalls a fall approximately a year and a half ago, resulting in swelling and bruising of the knee, which resolved over time. She exercises regularly, including leg presses and recumbent biking, and has adjusted her routine to accommodate the knee discomfort. She experienced a sensation of instability once when standing from a seated position but has not had any episodes of the knee giving out.  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 She also has a small benign cyst on one of her kidneys, which was deemed not concerning. She is on three medications for restless leg syndrome, including clonazepam, which helps her sleep through the night.       There are no preventive care reminders to display for this patient.  Past Medical History:  Diagnosis Date   Anxiety 2018   Arthritis Neck, back (herniated lumbar disc   Atherosclerosis    Cataract Surgery, 2020   Depression 2016   Hyperlipidemia 10 years   Neuromuscular disorder (HCC) Restless leg syndrome, 10 years   Thyroid disease    hypothyroidism    Past Surgical History:  Procedure Laterality Date   ABDOMINAL HYSTERECTOMY  20 years ago   cataract surgery  2020   COLONOSCOPY  09/27/2021   [facility]   EUS  07/09/2020   [facility]   EYE SURGERY Bilateral    RHYTIDECTOMY NECK / CHEEK / CHIN       Current Outpatient Medications:    clonazePAM (KLONOPIN) 0.5 MG tablet, Take 1 tablet (0.5 mg total) by mouth at bedtime., Disp: 90 tablet, Rfl: 1   estradiol (ESTRACE) 0.1 MG/GM vaginal cream, 0.5 g intravaginally 3 times per week., Disp: 42.5 g, Rfl: 2   gabapentin (NEURONTIN) 600 MG tablet, Take 1 tablet at 6p and 8p., Disp: 180 tablet, Rfl: 3   rOPINIRole (REQUIP) 1 MG tablet, Take 1 tablet at 6p, Disp: 90 tablet, Rfl: 3   rosuvastatin (CRESTOR) 10 MG tablet, Take  1 tablet (10 mg total) by mouth daily., Disp: 90 tablet, Rfl: 3   SYNTHROID 75 MCG tablet, TAKE 1 TABLET BY MOUTH DAILY, Disp: 90 tablet, Rfl: 3  Allergies  Allergen Reactions   Hydrocodone Nausea And Vomiting and Nausea Only   Sulfa Antibiotics Nausea Only and Rash    Other reaction(s): Headache   ROS neg/noncontributory except as noted HPI/below      Objective:     BP 105/65   Pulse 71   Temp 98.2 F (36.8 C) (Temporal)   Resp 18   Ht 5' 1.75" (1.568 m)   Wt 119 lb 4 oz (54.1 kg)   SpO2 95%   BMI 21.99 kg/m  Wt Readings from Last 3 Encounters:   04/18/23 119 lb 4 oz (54.1 kg)  03/20/23 121 lb (54.9 kg)  03/15/23 120 lb 9.6 oz (54.7 kg)    Physical Exam   Gen: WDWN NAD HEENT: NCAT, conjunctiva not injected, sclera nonicteric ABDOMEN:  BS+, soft, NTND, No HSM, no masses no cvat EXT:  no edema MSK: no gross abnormalities.  NEURO: A&O x3.  CN II-XII intact.  PSYCH: normal mood. Good eye contact Knee exam:R No deformity on inspection. No pain with palpation of knee landmarks. No effusion/swelling noted. FROM in flex/extension without crepitus. No popliteal fullness. Neg drawer test. Neg mcmurray test. No pain with valgus/varus stress. No PFgrind. No abnormal patellar mobility.  Results for orders placed or performed in visit on 04/18/23  POCT Urinalysis Dipstick (Automated)   Collection Time: 04/18/23  9:01 AM  Result Value Ref Range   Color, UA YELLOW    Clarity, UA CLEAR    Glucose, UA Negative Negative   Bilirubin, UA NEG    Ketones, UA NEG    Spec Grav, UA 1.015 1.010 - 1.025   Blood, UA NEG    pH, UA 6.5 5.0 - 8.0   Protein, UA Negative Negative   Urobilinogen, UA 0.2 0.2 or 1.0 E.U./dL   Nitrite, UA POSITIVE    Leukocytes, UA Negative Negative       Assessment & Plan:  Burning with urination -     POCT Urinalysis Dipstick (Automated) -     Urine Culture  Chronic pain of right knee  Assessment and Plan    Dysuria   She reports a burning sensation during urination and urgency with minimal output for about a month. Symptoms are persistent but not worsening, unlike typical UTIs. There is no fever or abdominal pain. Differential diagnosis includes a low-grade urinary tract infection or possible urethral polyp, OAB, other. She had bladder surgery in her late 19s for bladder repair but has not had a recent urological evaluation. Perform urine analysis and culture. Consider referral to a urologist if the urine culture is negative. Prescribe antibiotics if urine analysis indicates infection.  Right Knee Pain    She reports aching and popping in the right knee, particularly on the medial side, for about a month. There was trauma to the knee a year and a half ago. She experiences no significant pain or instability but occasionally feels a potential giving way. Differential diagnosis includes a medial meniscus tear or arthritis. She is active, regularly attends the gym, and is concerned about upcoming travel to [place]. Refer to sports medicine for further evaluation, including potential imaging and treatment options such as injections. Advise on the potential use of a knee brace for stability during travel.  General Health Maintenance   She is on estradiol for bladder health  and is aware of its benefits despite inconvenience. She has not taken recent antibiotics except for eye drops post-surgery and has had no yeast infections since youth. She is proactive about her health, regularly exercises, and is informed about her medical history. Continue estradiol therapy for bladder health.      For UA-only + is nitrates and wbc neg, will hold on tx until  cx is back.   Return if symptoms worsen or fail to improve.  Angelena Sole, MD

## 2023-04-18 NOTE — Progress Notes (Unsigned)
 I, Rolland Bimler am a scribe for Dr. Clementeen Graham, MD.  Linda Mcmillan is a 73 y.o. female who presents to [facility] at [facility] today for R knee pain ongoing for about a month. She notes a fall, but it was over a 1 1/2 years ago. Pt locates pain to mostly in the inner knee. It aches it isn't painful. One year and a half ago had a fall in the parking lot where she fell on her knee and injured her knee cap. Recently had an episode where she was in a turning motion and it felt like it was going to give way. This has not happened before and patient is concerned. She works out 4 times a week. Does the leg press at gym but recently uses left more than right. Recumbent bike as well. No issues with these motions. The aching is random in nature. In two months she is going to [place] and want to make sure everything is alright.  R Knee swelling: no Mechanical symptoms: Aggravates: Treatments tried: none  Pertinent review of systems: No fevers or chills  Relevant historical information: Her son has chronic pain which is difficult to manage.  This is weighing on her mind. Personal history of prediabetes  Exam:  BP 120/64   Pulse 86   Ht 5\' 1"  (1.549 m)   Wt 118 lb (53.5 kg)   SpO2 96%   BMI 22.30 kg/m  General: Well Developed, well nourished, and in no acute distress.   MSK: Right knee no effusion Normal-appearing Normal motion. Tender palpation medial joint line. Stable ligamentous exam. Negative McMurray's test.    Lab and Radiology Results  Procedure: Real-time Ultrasound Guided Injection of right knee joint superior lateral patella space Device: Philips Affiniti 50G/GE Logiq Images permanently stored and available for review in PACS Verbal informed consent obtained.  Discussed risks and benefits of procedure. Warned about infection, bleeding, hyperglycemia damage to structures among others. Patient expresses understanding and agreement Time-out conducted.    Noted no overlying erythema, induration, or other signs of local infection.   Skin prepped in a sterile fashion.   Local anesthesia: Topical Ethyl chloride.   With sterile technique and under real time ultrasound guidance: 40 mg of Kenalog and 2 mL of Marcaine injected into knee joint. Fluid seen entering the joint capsule.   Completed without difficulty   Pain immediately resolved suggesting accurate placement of the medication.   Advised to call if fevers/chills, erythema, induration, drainage, or persistent bleeding.   Images permanently stored and available for review in the ultrasound unit.  Impression: Technically successful ultrasound guided injection.   X-ray images right knee obtained today personally and independently interpreted. Minimal degenerative changes.  No acute fractures are visible. Await formal radiology review      Assessment and Plan: 73 y.o. female with exacerbation of chronic right knee pain due to degenerative changes or possible degenerative medial meniscus tear or fraying.  Plan for steroid injection today.  We spent some time talking about her son's chronic pain.  Recommended self-guided cognitive behavioral therapy book.  No billing or charging for the discussion about her son today.   PDMP not reviewed this encounter. Orders Placed This Encounter  Procedures   [place] LIMITED JOINT SPACE STRUCTURES LOW RIGHT(NO LINKED CHARGES)    Reason for Exam (SYMPTOM  OR DIAGNOSIS REQUIRED):   right knee pain    Preferred imaging location?:   [facility] Knee AP/LAT  W/Sun[place] Right    Standing Status:   Future    Number of Occurrences:   1    Expiration Date:   04/18/2024    Reason for Exam (SYMPTOM  OR DIAGNOSIS REQUIRED):   right knee pain    Preferred imaging location?:   [facility]   No orders of the defined types were placed in this encounter.    Discussed warning signs or symptoms. Please see discharge instructions.  Patient expresses understanding.   The above documentation has been reviewed and is accurate and complete Clementeen Graham, M.D.

## 2023-04-19 ENCOUNTER — Ambulatory Visit: Admitting: Family Medicine

## 2023-04-19 ENCOUNTER — Ambulatory Visit

## 2023-04-19 ENCOUNTER — Other Ambulatory Visit: Payer: Self-pay

## 2023-04-19 VITALS — BP 120/64 | HR 86 | Ht 61.0 in | Wt 118.0 lb

## 2023-04-19 DIAGNOSIS — M25561 Pain in right knee: Secondary | ICD-10-CM

## 2023-04-19 DIAGNOSIS — G8929 Other chronic pain: Secondary | ICD-10-CM

## 2023-04-19 NOTE — Patient Instructions (Addendum)
 Thank you for coming in today.  Injected R knee today. X-ray on the way out.  The Pain Management Workbook: Powerful CBT and Mindfulness Skills to Take Control of Pain and Reclaim Your Life [place] Return to [place] prior to going to [place].

## 2023-04-20 ENCOUNTER — Encounter: Payer: Self-pay | Admitting: Family Medicine

## 2023-04-20 ENCOUNTER — Other Ambulatory Visit: Payer: Self-pay | Admitting: *Deleted

## 2023-04-20 LAB — URINE CULTURE
MICRO NUMBER:: 16156610
SPECIMEN QUALITY:: ADEQUATE

## 2023-04-20 MED ORDER — CEPHALEXIN 500 MG PO CAPS: 500.0000 mg | ORAL_CAPSULE | Freq: Two times a day (BID) | ORAL | 0 refills | Status: AC

## 2023-04-20 NOTE — Progress Notes (Signed)
 She is growing some bacteria.  Send in keflex 500mg  bid x 7 days.  Sensitivities could take 2 more days, so start w/this

## 2023-04-21 ENCOUNTER — Ambulatory Visit: Payer: Self-pay | Admitting: Family Medicine

## 2023-04-21 ENCOUNTER — Other Ambulatory Visit: Payer: Self-pay | Admitting: Family

## 2023-04-21 MED ORDER — CIPROFLOXACIN HCL 500 MG PO TABS: 500.0000 mg | ORAL_TABLET | Freq: Two times a day (BID) | ORAL | 0 refills | Status: AC

## 2023-04-21 NOTE — Telephone Encounter (Signed)
 Copied from CRM [id number]. Topic: Clinical - Medication Question >> Apr 21, 2023  9:32 AM Gurney Maxin H wrote: Reason for CRM: Patient has questions about taking the cephALEXin (KEFLEX) 500 MG capsule, states she's has this medication for UTI in the past and it hasn't worked. Wants to discuss another option.  [facility] [phone]   Chief Complaint: Information Only Disposition: [] ED /[] Urgent Care (no appt availability in office) / [] Appointment(In office/virtual)/ []  [facility]/ [] Home Care/ [] Refused Recommended Disposition /[] [facility]/ [x]  Follow-up with PCP Additional Notes: The patient called in regarding the follow up for the treatment plan for her ongoing UTI. The patient noted that her urine culture resulted via myChart and was instructed by Dr. Ruthine Dose to follow up on treatment plan. The patient asked in addition to treatment plan adjustment she would like a callback from the clinical faculty or administrative faculty as well at [phone]

## 2023-04-21 NOTE — Telephone Encounter (Signed)
 Please see pt concerns and advise

## 2023-04-25 ENCOUNTER — Telehealth: Payer: Self-pay | Admitting: *Deleted

## 2023-04-25 NOTE — Telephone Encounter (Signed)
 Patient stated that she has only been taking 1 dose, 500 mg per day, after speaking with pharmacist and they stated she was prescribed a high dose for someone that has severe sx and she barely had any. Sx are gone now after 5 days of 1 dose per day. Patient questioned if she should do another UA to make sure UTI is gone after completing medication. Patient stated Dr. Ruthine Dose know all about what is going on. She would really be more comfortable if Dr. Ruthine Dose actually call her to discuss her concerns. Patient notified that I would give Dr. Ruthine Dose the message, can't make any promises.

## 2023-04-25 NOTE — Telephone Encounter (Signed)
 Copied from CRM [id number]. Topic: Clinical - Medication Question >> Apr 25, 2023 10:09 AM Sim Boast F wrote: Reason for CRM: Patient called Friday and spoke to triage nurse that was unable to help her, it is now Tuesday and she hasn't received a call back regarding antibiotics. Patient request to speak with Dr. Ruthine Dose some time today. She declined video visit and would prefer that Dr. Ruthine Dose calls her. If she can get a phone call today from someone in clinical addressing her concerns that would be helpful. Per message on 3/7, she wants to discuss another option for meds for her UTI

## 2023-04-26 ENCOUNTER — Encounter: Payer: Self-pay | Admitting: Family Medicine

## 2023-04-26 NOTE — Telephone Encounter (Signed)
 Patient notified of message below.

## 2023-05-02 ENCOUNTER — Ambulatory Visit: Admitting: Family Medicine

## 2023-05-08 ENCOUNTER — Encounter: Payer: Self-pay | Admitting: Family Medicine

## 2023-05-08 NOTE — Progress Notes (Signed)
 Right knee x-ray looks normal to radiology.  No arthritis is visible on x-ray.  No broken bones.

## 2023-05-28 ENCOUNTER — Encounter: Payer: Self-pay | Admitting: Family Medicine

## 2023-05-30 NOTE — Progress Notes (Unsigned)
   Patient ID: Linda Mcmillan, female    DOB: 09/23/1950, 73 y.o.   MRN: [id number]  No chief complaint on file.           Assessment & Plan:   Subjective:    Outpatient Medications Prior to Visit  Medication Sig Dispense Refill   clonazePAM (KLONOPIN) 0.5 MG tablet Take 1 tablet (0.5 mg total) by mouth at bedtime. 90 tablet 1   estradiol (ESTRACE) 0.1 MG/GM vaginal cream 0.5 g intravaginally 3 times per week. 42.5 g 2   gabapentin (NEURONTIN) 600 MG tablet Take 1 tablet at 6p and 8p. 180 tablet 3   rOPINIRole (REQUIP) 1 MG tablet Take 1 tablet at 6p 90 tablet 3   rosuvastatin (CRESTOR) 10 MG tablet Take 1 tablet (10 mg total) by mouth daily. 90 tablet 3   SYNTHROID 75 MCG tablet TAKE 1 TABLET BY MOUTH DAILY 90 tablet 3   No facility-administered medications prior to visit.   Past Medical History:  Diagnosis Date   Anxiety 2018   Arthritis Neck, back (herniated lumbar disc   Atherosclerosis    Cataract Surgery, 2020   Depression 2016   Hyperlipidemia 10 years   Neuromuscular disorder (HCC) Restless leg syndrome, 10 years   Thyroid disease    hypothyroidism   Past Surgical History:  Procedure Laterality Date   ABDOMINAL HYSTERECTOMY  20 years ago   cataract surgery  2020   COLONOSCOPY  09/27/2021   [name]   E[facility]  07/09/2020   [name]   EYE SURGERY Bilateral    RHYTIDECTOMY NECK / CHEEK / CHIN     Allergies  Allergen Reactions   Hydrocodone Nausea And Vomiting and Nausea Only   Sulfa Antibiotics Nausea Only and Rash    Other reaction(s): Headache      Objective:    Physical Exam Vitals and nursing note reviewed.  Constitutional:      Appearance: Normal appearance.  Cardiovascular:     Rate and Rhythm: Normal rate and regular rhythm.  Pulmonary:     Effort: Pulmonary effort is normal.     Breath sounds: Normal breath sounds.  Musculoskeletal:        General: Normal range of motion.  Skin:    General: Skin is warm and dry.   Neurological:     Mental Status: She is alert.  Psychiatric:        Mood and Affect: Mood normal.        Behavior: Behavior normal.    There were no vitals taken for this visit. Wt Readings from Last 3 Encounters:  04/19/23 118 lb (53.5 kg)  04/18/23 119 lb 4 oz (54.1 kg)  03/20/23 121 lb (54.9 kg)       Versa Gore, NP

## 2023-05-31 ENCOUNTER — Ambulatory Visit (INDEPENDENT_AMBULATORY_CARE_PROVIDER_SITE_OTHER): Admitting: Family

## 2023-05-31 ENCOUNTER — Encounter: Payer: Self-pay | Admitting: Family

## 2023-05-31 VITALS — BP 102/63 | HR 63 | Temp 97.9°F | Ht 61.0 in | Wt 119.2 lb

## 2023-05-31 DIAGNOSIS — R300 Dysuria: Secondary | ICD-10-CM | POA: Diagnosis not present

## 2023-05-31 DIAGNOSIS — K648 Other hemorrhoids: Secondary | ICD-10-CM

## 2023-05-31 LAB — POCT URINALYSIS DIPSTICK
Bilirubin, UA: NEGATIVE
Blood, UA: NEGATIVE
Glucose, UA: NEGATIVE
Ketones, UA: NEGATIVE
Leukocytes, UA: NEGATIVE
Nitrite, UA: NEGATIVE
Protein, UA: NEGATIVE
Spec Grav, UA: 1.015 (ref 1.010–1.025)
Urobilinogen, UA: 0.2 U/dL
pH, UA: 8 (ref 5.0–8.0)

## 2023-06-06 ENCOUNTER — Encounter (HOSPITAL_COMMUNITY): Payer: Self-pay

## 2023-06-06 ENCOUNTER — Emergency Department (HOSPITAL_COMMUNITY)

## 2023-06-06 ENCOUNTER — Emergency Department (HOSPITAL_COMMUNITY)
Admission: EM | Admit: 2023-06-06 | Discharge: 2023-06-06 | Disposition: A | Discharge status: Home / Self Care | Attending: Emergency Medicine | Admitting: Emergency Medicine

## 2023-06-06 ENCOUNTER — Other Ambulatory Visit: Payer: Self-pay

## 2023-06-06 DIAGNOSIS — R519 Headache, unspecified: Secondary | ICD-10-CM | POA: Diagnosis present

## 2023-06-06 LAB — CBC WITH DIFFERENTIAL/PLATELET
Abs Immature Granulocytes: 0.01 10*3/uL (ref 0.00–0.07)
Basophils Absolute: 0 10*3/uL (ref 0.0–0.1)
Basophils Relative: 1 %
Eosinophils Absolute: 0.1 10*3/uL (ref 0.0–0.5)
Eosinophils Relative: 2 %
HCT: 42.1 % (ref 36.0–46.0)
Hemoglobin: 13.8 g/dL (ref 12.0–15.0)
Immature Granulocytes: 0 %
Lymphocytes Relative: 31 %
Lymphs Abs: 1.7 10*3/uL (ref 0.7–4.0)
MCH: 30.9 pg (ref 26.0–34.0)
MCHC: 32.8 g/dL (ref 30.0–36.0)
MCV: 94.4 fL (ref 80.0–100.0)
Monocytes Absolute: 0.7 10*3/uL (ref 0.1–1.0)
Monocytes Relative: 13 %
Neutro Abs: 3 10*3/uL (ref 1.7–7.7)
Neutrophils Relative %: 53 %
Platelets: 256 10*3/uL (ref 150–400)
RBC: 4.46 MIL/uL (ref 3.87–5.11)
RDW: 13.2 % (ref 11.5–15.5)
WBC: 5.5 10*3/uL (ref 4.0–10.5)
nRBC: 0 % (ref 0.0–0.2)

## 2023-06-06 LAB — COMPREHENSIVE METABOLIC PANEL WITH GFR
ALT: 30 U/L (ref 0–44)
AST: 42 U/L — ABNORMAL HIGH (ref 15–41)
Albumin: 4.2 g/dL (ref 3.5–5.0)
Alkaline Phosphatase: 36 U/L — ABNORMAL LOW (ref 38–126)
Anion gap: 9 (ref 5–15)
BUN: 17 mg/dL (ref 8–23)
CO2: 28 mmol/L (ref 22–32)
Calcium: 9.4 mg/dL (ref 8.9–10.3)
Chloride: 100 mmol/L (ref 98–111)
Creatinine, Ser: 0.44 mg/dL (ref 0.44–1.00)
GFR, Estimated: >60 mL/min (ref >60)
Glucose, Bld: 107 mg/dL — ABNORMAL HIGH (ref 70–99)
Potassium: 3.9 mmol/L (ref 3.5–5.1)
Sodium: 137 mmol/L (ref 135–145)
Total Bilirubin: 0.7 mg/dL (ref 0.0–1.2)
Total Protein: 7.4 g/dL (ref 6.5–8.1)

## 2023-06-06 LAB — URINALYSIS, ROUTINE W REFLEX MICROSCOPIC
Bilirubin Urine: NEGATIVE
Glucose, UA: NEGATIVE mg/dL
Hgb urine dipstick: NEGATIVE
Ketones, ur: NEGATIVE mg/dL
Leukocytes,Ua: NEGATIVE
Nitrite: NEGATIVE
Protein, ur: NEGATIVE mg/dL
Specific Gravity, Urine: 1.009 (ref 1.005–1.030)
pH: 8 (ref 5.0–8.0)

## 2023-06-06 MED ORDER — ACETAMINOPHEN 325 MG PO TABS
650.0000 mg | ORAL_TABLET | Freq: Once | ORAL | Status: AC
  Administered 2023-06-06: 650 mg via ORAL
  Filled 2023-06-06: qty 2

## 2023-06-06 NOTE — ED Triage Notes (Signed)
 Pt arrived reporting she has a headache, frontal headache. Started last night reports being really stressed with a situation. States has vision problems and vision is a little more blurred. No N/V, cp,dizziness or any other symptoms

## 2023-06-06 NOTE — ED Provider Notes (Signed)
 [place] EMERGENCY DEPARTMENT AT [facility] Provider Note   CSN: [id number] Arrival date & time: 06/06/23  1146     History  Chief Complaint  Patient presents with   Headache    Linda Mcmillan is a 73 y.o. female.   Headache Patient presents with headache.  Frontal headache.  Began acutely yesterday while she was at a bad argument with her son.  Reportedly a lot of stress with it.  Became severe quickly.  Did not last as severe for very long though.  Has had a dull headache.  States her head does feel little full still.  No numbness or weakness.  No fever.  No nausea or vomiting.  Not on blood thinners.  Denies dysuria.  Pain at the time went down the back of her neck.     Home Medications Prior to Admission medications   Medication Sig Start Date End Date Taking? Authorizing Provider  clonazePAM  (KLONOPIN ) 0.5 MG tablet Take 1 tablet (0.5 mg total) by mouth at bedtime. 03/20/23   Patel, Donika K, DO  estradiol  (ESTRACE ) 0.1 MG/GM vaginal cream 0.5 g intravaginally 3 times per week. 11/21/22   Christel Cousins, MD  gabapentin  (NEURONTIN ) 600 MG tablet Take 1 tablet at 6p and 8p. 03/20/23   Patel, Donika K, DO  rOPINIRole  (REQUIP ) 1 MG tablet Take 1 tablet at 6p 03/20/23   Patel, Donika K, DO  rosuvastatin  (CRESTOR ) 10 MG tablet Take 1 tablet (10 mg total) by mouth daily. 12/14/22   Christel Cousins, MD  SYNTHROID  75 MCG tablet TAKE 1 TABLET BY MOUTH DAILY 12/14/22   Christel Cousins, MD      Allergies    Hydrocodone and Sulfa antibiotics    Review of Systems   Review of Systems  Neurological:  Positive for headaches.    Physical Exam Updated Vital Signs BP 118/78 (BP Location: Right Arm)   Pulse 68   Temp 98 F (36.7 C) (Oral)   Resp 18   Ht 5\' 1"  (1.549 m)   Wt 54 kg   SpO2 99%   BMI 22.49 kg/m  Physical Exam Vitals and nursing note reviewed.  HENT:     Head: Normocephalic.  Cardiovascular:     Rate and Rhythm: Normal rate and regular rhythm.   Pulmonary:     Breath sounds: No wheezing or rhonchi.  Musculoskeletal:     Cervical back: Normal range of motion and neck supple. No rigidity.  Skin:    General: Skin is warm.     Capillary Refill: Capillary refill takes less than 2 seconds.  Neurological:     Mental Status: She is alert.     ED Results / Procedures / Treatments   Labs (all labs ordered are listed, but only abnormal results are displayed) Labs Reviewed  COMPREHENSIVE METABOLIC PANEL WITH GFR - Abnormal; Notable for the following components:      Result Value   Glucose, Bld 107 (*)    AST 42 (*)    Alkaline Phosphatase 36 (*)    All other components within normal limits  URINALYSIS, ROUTINE W REFLEX MICROSCOPIC - Abnormal; Notable for the following components:   APPearance HAZY (*)    All other components within normal limits  CBC WITH DIFFERENTIAL/PLATELET    EKG None  Radiology CT Head Wo Contrast Result Date: 06/06/2023 CLINICAL DATA:  Headache. EXAM: CT HEAD WITHOUT CONTRAST TECHNIQUE: Contiguous axial images were obtained from the base of the skull through the  vertex without intravenous contrast. RADIATION DOSE REDUCTION: This exam was performed according to the departmental dose-optimization program which includes automated exposure control, adjustment of the mA and/or kV according to patient size and/or use of iterative reconstruction technique. COMPARISON:  None Available. FINDINGS: Brain: No evidence of acute infarction, hemorrhage, hydrocephalus, extra-axial collection or mass lesion/mass effect. Vascular: No hyperdense vessel or unexpected calcification. Skull: Normal. Negative for fracture or focal lesion. Sinuses/Orbits: Normal. Negative for fracture or focal lesion. Other: None. IMPRESSION: No acute intracranial abnormalities. Electronically Signed   By: Kimberley Penman M.D.   On: 06/06/2023 14:40    Procedures Procedures    Medications Ordered in ED Medications  acetaminophen  (TYLENOL ) tablet  650 mg (650 mg Oral Given 06/06/23 1333)    ED Course/ Medical Decision Making/ A&P                                 Medical Decision Making  Patient with acute onset of headache.  Differential diagnoses long but includes cause such as stress headache, intracranial hemorrhage, subarachnoid hemorrhage.  Is more worrisome since he did get an acute onset.  However did improve.  Head CT reassuring although was not obtained within the 6 hours of the start of symptoms.  Discussed with patient and her son about potential further workup for this versus just monitoring at home.  We think is reasonable to just monitor at this time.  Do not think we need lumbar puncture or angiogram at this time but aware of was not having ruled out with a very high degree of certainty.  Appears stable for discharge home.        Final Clinical Impression(s) / ED Diagnoses Final diagnoses:  Bad headache    Rx / DC Orders ED Discharge Orders     None         Mozell Arias, MD 06/06/23 1750

## 2023-06-12 ENCOUNTER — Other Ambulatory Visit: Payer: Self-pay | Admitting: *Deleted

## 2023-06-12 ENCOUNTER — Encounter: Payer: Self-pay | Admitting: Family Medicine

## 2023-06-12 ENCOUNTER — Other Ambulatory Visit: Payer: Self-pay | Admitting: Family Medicine

## 2023-06-12 DIAGNOSIS — Z1231 Encounter for screening mammogram for malignant neoplasm of breast: Secondary | ICD-10-CM

## 2023-06-12 DIAGNOSIS — M8580 Other specified disorders of bone density and structure, unspecified site: Secondary | ICD-10-CM

## 2023-06-12 DIAGNOSIS — Z780 Asymptomatic menopausal state: Secondary | ICD-10-CM

## 2023-06-13 ENCOUNTER — Ambulatory Visit (INDEPENDENT_AMBULATORY_CARE_PROVIDER_SITE_OTHER): Admitting: Family Medicine

## 2023-06-13 ENCOUNTER — Other Ambulatory Visit: Payer: Self-pay

## 2023-06-13 ENCOUNTER — Encounter: Payer: Self-pay | Admitting: Family Medicine

## 2023-06-13 VITALS — BP 102/70 | HR 76 | Ht 61.0 in | Wt 114.0 lb

## 2023-06-13 DIAGNOSIS — G8929 Other chronic pain: Secondary | ICD-10-CM | POA: Diagnosis not present

## 2023-06-13 DIAGNOSIS — M25561 Pain in right knee: Secondary | ICD-10-CM

## 2023-06-13 DIAGNOSIS — M1711 Unilateral primary osteoarthritis, right knee: Secondary | ICD-10-CM | POA: Diagnosis not present

## 2023-06-13 MED ORDER — PREDNISONE 50 MG PO TABS
50.0000 mg | ORAL_TABLET | Freq: Every day | ORAL | 0 refills | Status: AC
Start: 2023-06-13 — End: 2023-06-18

## 2023-06-13 MED ORDER — TRIAMCINOLONE ACETONIDE 32 MG IX SRER
32.0000 mg | Freq: Once | INTRA_ARTICULAR | Status: AC
Start: 2023-06-13 — End: 2023-06-13
  Administered 2023-06-13: 32 mg via INTRA_ARTICULAR

## 2023-06-13 NOTE — Progress Notes (Signed)
   I, Miquel Amen, CMA acting as a scribe for Garlan Juniper, MD.  Linda Mcmillan is a 73 y.o. female who presents to [facility] at [facility] today for f/u R knee pain. Pt was last seen by Dr. Alease Hunter on 04/19/23 and was given a R knee steroid injection.  Today, pt reports minimal improvement with intra-articular steroid injection at last visit. Denies swelling. Mechanical sx present. Has started working out to [employer], pops with leg extensions (lateral aspect). Has upcoming trip to [place] in 1.5 weeks. Tried OTC compression but stopped wearing because it kept sliding down. Plans to wear compression stockings during air travel.   Dx imaging: 04/19/23 R knee XR  Pertinent review of systems: No fevers or chills  Relevant historical information: Aortic atherosclerosis .   Exam:  BP 102/70   Pulse 76   Ht 5\' 1"  (1.549 m)   Wt 114 lb (51.7 kg)   SpO2 96%   BMI 21.54 kg/m  General: Well Developed, well nourished, and in no acute distress.   MSK: Right knee minimal effusion normal motion with crepitation.  Tender palpation medial joint line.    Lab and Radiology Results   Zilretta  injection right knee Procedure: Real-time Ultrasound Guided Injection of right knee joint superior lateral patellar space Device: Philips Affiniti 50G Images permanently stored and available for review in PACS Verbal informed consent obtained.  Discussed risks and benefits of procedure. Warned about infection, hyperglycemia bleeding, damage to structures among others. Patient expresses understanding and agreement Time-out conducted.   Noted no overlying erythema, induration, or other signs of local infection.   Skin prepped in a sterile fashion.   Local anesthesia: Topical Ethyl chloride.   With sterile technique and under real time ultrasound guidance: Zilretta  32 mg injected into knee joint. Fluid seen entering the joint capsule.   Completed without difficulty   Advised to call if  fevers/chills, erythema, induration, drainage, or persistent bleeding.   Images permanently stored and available for review in the ultrasound unit.  Impression: Technically successful ultrasound guided injection.       Assessment and Plan: 73 y.o. female with chronic right knee pain.  Pain due to mild degenerative changes or degenerative meniscus tear.  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  Plan for Zilretta  injection today.  Additionally I prescribed prednisone that she can take on her travels.  She declined stronger medicines for pain such as hydrocodone.  If not improving enough when she returns next step would typically be MRI to evaluate degenerative meniscus tear or osteochondral lesion.   PDMP not reviewed this encounter. Orders Placed This Encounter  Procedures   US  LIMITED JOINT SPACE STRUCTURES LOW RIGHT(NO LINKED CHARGES)    Reason for Exam (SYMPTOM  OR DIAGNOSIS REQUIRED):   right knee pain    Preferred imaging location?:   [facility] ordered this encounter  Medications   predniSONE (DELTASONE) 50 MG tablet    Sig: Take 1 tablet (50 mg total) by mouth daily with breakfast for 5 days.    Dispense:  5 tablet    Refill:  0   Triamcinolone  Acetonide (ZILRETTA ) intra-articular injection 32 mg     Discussed warning signs or symptoms. Please see discharge instructions. Patient expresses understanding.   The above documentation has been reviewed and is accurate and complete Garlan Juniper, M.D.

## 2023-06-13 NOTE — Patient Instructions (Addendum)
 Thank you for coming in today.   Check out the CEP and Body Helix full knee compression sleeves [email or web address]://[email or web address].com/collections/knee-compression [email or web address]://[email or web address]/knee-pain  You received an injection today. Seek immediate medical attention if the joint becomes red, extremely painful, or is oozing fluid.   Use the prednisone as needed while away if not better.   Once you return if this knee is still bothering you let me know and I will order a MRI.

## 2023-06-20 ENCOUNTER — Telehealth: Payer: Self-pay

## 2023-06-20 NOTE — Telephone Encounter (Signed)
-----   Message from Nurse Bea Lime sent at 12/21/2022  9:20 AM EST ----- Personal reminder placed in Epic on 12/21/2022   checking LFTs, lipase periodically-like every 6 months x 2 yrs ( Dr. [name])

## 2023-06-20 NOTE — Telephone Encounter (Signed)
 Reminder received in Epic. Pt  stated that she had labs drawn on 06/06/2023 during an stressful ER visit due to an intense headache that was brought on by stress in her family. Please review recent labs.

## 2023-06-21 ENCOUNTER — Other Ambulatory Visit: Payer: Self-pay

## 2023-06-21 DIAGNOSIS — K760 Fatty (change of) liver, not elsewhere classified: Secondary | ICD-10-CM

## 2023-06-21 DIAGNOSIS — R748 Abnormal levels of other serum enzymes: Secondary | ICD-10-CM

## 2023-06-21 NOTE — Telephone Encounter (Signed)
 Very minimally dilated AST/ALT    Latest Ref Rng & Units 06/06/2023   12:32 PM 12/14/2022   10:42 AM 06/15/2022    8:04 AM  Hepatic Function  Total Protein 6.5 - 8.1 g/dL 7.4  7.0  7.0   Albumin 3.5 - 5.0 g/dL 4.2  4.1  4.2   AST 15 - 41 U/L 42  27  27   ALT 0 - 44 U/L 30  20  20    Alk Phosphatase 38 - 126 U/L 36  45  39   Total Bilirubin 0.0 - 1.2 mg/dL 0.7  0.4  0.3   Bilirubin, Direct 0.0 - 0.3 mg/dL   0.1    Repeat LFTs in 4 weeks.

## 2023-06-21 NOTE — Telephone Encounter (Signed)
 Pt made aware. Order for labs placed. Pt made aware to have labs drawn in 4 weeks.  Pt verbalized understanding with all questions answered.

## 2023-07-06 ENCOUNTER — Encounter: Payer: Self-pay | Admitting: Family Medicine

## 2023-07-07 ENCOUNTER — Encounter: Payer: Self-pay | Admitting: Family Medicine

## 2023-07-07 ENCOUNTER — Ambulatory Visit: Payer: Self-pay | Admitting: Gastroenterology

## 2023-07-07 ENCOUNTER — Other Ambulatory Visit (INDEPENDENT_AMBULATORY_CARE_PROVIDER_SITE_OTHER)

## 2023-07-07 ENCOUNTER — Ambulatory Visit (INDEPENDENT_AMBULATORY_CARE_PROVIDER_SITE_OTHER): Admitting: Family Medicine

## 2023-07-07 VITALS — BP 112/67 | HR 71 | Temp 97.5°F | Resp 16 | Ht 61.0 in | Wt 115.5 lb

## 2023-07-07 DIAGNOSIS — N3000 Acute cystitis without hematuria: Secondary | ICD-10-CM | POA: Diagnosis not present

## 2023-07-07 DIAGNOSIS — K760 Fatty (change of) liver, not elsewhere classified: Secondary | ICD-10-CM

## 2023-07-07 DIAGNOSIS — R300 Dysuria: Secondary | ICD-10-CM

## 2023-07-07 DIAGNOSIS — R748 Abnormal levels of other serum enzymes: Secondary | ICD-10-CM | POA: Diagnosis not present

## 2023-07-07 LAB — POC URINALSYSI DIPSTICK (AUTOMATED)
Bilirubin, UA: NEGATIVE
Blood, UA: NEGATIVE
Glucose, UA: NEGATIVE
Ketones, UA: NEGATIVE
Nitrite, UA: POSITIVE
Protein, UA: NEGATIVE
Spec Grav, UA: 1.015 (ref 1.010–1.025)
Urobilinogen, UA: 0.2 U/dL
pH, UA: 6 (ref 5.0–8.0)

## 2023-07-07 LAB — HEPATIC FUNCTION PANEL
ALT: 30 U/L (ref 0–35)
AST: 30 U/L (ref 0–37)
Albumin: 4.5 g/dL (ref 3.5–5.2)
Alkaline Phosphatase: 40 U/L (ref 39–117)
Bilirubin, Direct: 0.1 mg/dL (ref 0.0–0.3)
Total Bilirubin: 0.5 mg/dL (ref 0.2–1.2)
Total Protein: 7.5 g/dL (ref 6.0–8.3)

## 2023-07-07 LAB — LIPASE: Lipase: 63 U/L — ABNORMAL HIGH (ref 11.0–59.0)

## 2023-07-07 MED ORDER — CIPROFLOXACIN HCL 500 MG PO TABS: 500.0000 mg | ORAL_TABLET | Freq: Two times a day (BID) | ORAL | 0 refills | Status: AC

## 2023-07-07 NOTE — Progress Notes (Signed)
 Subjective:     Patient ID: Linda Mcmillan, female    DOB: 20-Apr-1950, 73 y.o.   MRN: [id number]  Chief Complaint  Patient presents with   Dysuria    Still having burning and discomfort with urination in lower pelvic area Sx started over 1 month ago    HPI Discussed the use of AI scribe software for clinical note transcription with the patient, who gave verbal consent to proceed.  History of Present Illness Linda Mcmillan is a 73 year old female who presents with persistent urinary symptoms suggestive of a urinary tract infection (UTI).  She has been experiencing urinary symptoms for approximately two months, characterized by persistent, low-grade discomfort. Unlike previous UTIs, which were more severe with symptoms like stomach aches, burning sensation, and cloudy urine, this episode is less severe but persistent. Symptoms include frequent urination with small amounts of urine, a constant feeling of needing to urinate, and intermittent tenderness in the lower abdomen. No fever or new back pain.  Cipro  has been effective for her UTIs in the past, but during a recent episode, she received only a half dose from the pharmacist, which she believes may have contributed to the persistence of her symptoms. She has not had a UTI for about two years prior to this episode.  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 She has been undergoing regular liver enzyme tests, which have generally been normal, but recent tests showed some abnormalities. She has been taking estradiol  cream for vaginal dryness, which she paused during a recent trip to [place].  She denies being sexually active and has been taking probiotics and consuming yogurt as part of her self-care routine.    Health Maintenance Due  Topic Date Due   Medicare Annual Wellness (AWV)  08/11/2023    Past Medical History:  Diagnosis Date   Anxiety  2018   Arthritis Neck, back (herniated lumbar disc   Atherosclerosis    Cataract Surgery, 2020   Depression 2016   Hyperlipidemia 10 years   Neuromuscular disorder (HCC) Restless leg syndrome, 10 years   Restless leg syndrome   Thyroid  disease 15 years   hypothyroidism    Past Surgical History:  Procedure Laterality Date   ABDOMINAL HYSTERECTOMY  30 years ago   Partial   cataract surgery  2020   COLONOSCOPY  09/27/2021   [name]   COSMETIC SURGERY  08/24   Chin/neck lift   EUS  07/09/2020   [facility]   EYE SURGERY Bilateral    RK, left eye nodule removed   RHYTIDECTOMY NECK / CHEEK / CHIN     TUBAL LIGATION     In my mid-30s     Current Outpatient Medications:    ciprofloxacin  (CIPRO ) 500 MG tablet, Take 1 tablet (500 mg total) by mouth 2 (two) times daily for 7 days., Disp: 14 tablet, Rfl: 0   clonazePAM  (KLONOPIN ) 0.5 MG tablet, Take 1 tablet (0.5 mg total) by mouth at bedtime., Disp: 90 tablet, Rfl: 1   estradiol  (ESTRACE ) 0.1 MG/GM vaginal cream, 0.5 g intravaginally 3 times per week., Disp: 42.5 g, Rfl: 2   gabapentin  (NEURONTIN ) 600 MG tablet, Take 1 tablet at 6p and 8p., Disp: 180 tablet, Rfl: 3   rOPINIRole  (REQUIP ) 1 MG tablet, Take 1 tablet at 6p, Disp: 90 tablet, Rfl: 3   rosuvastatin  (CRESTOR ) 10 MG tablet, Take 1 tablet (10 mg  total) by mouth daily., Disp: 90 tablet, Rfl: 3   SYNTHROID  75 MCG tablet, TAKE 1 TABLET BY MOUTH DAILY, Disp: 90 tablet, Rfl: 3  Allergies  Allergen Reactions   Hydrocodone Nausea And Vomiting and Nausea Only   Sulfa Antibiotics Nausea Only and Rash    Other reaction(s): Headache   ROS neg/noncontributory except as noted HPI/below      Objective:      BP 112/67   Pulse 71   Temp (!) 97.5 F (36.4 C) (Temporal)   Resp 16   Ht 5\' 1"  (1.549 m)   Wt 115 lb 8 oz (52.4 kg)   SpO2 98%   BMI 21.82 kg/m  Wt Readings from Last 3 Encounters:  07/07/23 115 lb 8 oz (52.4 kg)  06/13/23 114 lb (51.7 kg)   06/06/23 119 lb 0.8 oz (54 kg)    Physical Exam   Gen: WDWN NAD HEENT: NCAT, conjunctiva not injected, sclera nonicteric ABDOMEN:  BS+, soft, sl tender suprapubically, No HSM, no masses, no CVAT EXT:  no edema MSK: no gross abnormalities.  NEURO: A&O x3.  CN II-XII intact.  PSYCH: normal mood. Good eye contact  Results for orders placed or performed in visit on 07/07/23  POCT Urinalysis Dipstick (Automated)   Collection Time: 07/07/23  3:06 PM  Result Value Ref Range   Color, UA YELLOW    Clarity, UA CLEAR    Glucose, UA Negative Negative   Bilirubin, UA NEG    Ketones, UA NEG    Spec Grav, UA 1.015 1.010 - 1.025   Blood, UA NEG    pH, UA 6.0 5.0 - 8.0   Protein, UA Negative Negative   Urobilinogen, UA 0.2 0.2 or 1.0 E.U./dL   Nitrite, UA POS    Leukocytes, UA Moderate (2+) (A) Negative    Discussed labs from GI per pt request     Assessment & Plan:  Dysuria -     POCT Urinalysis Dipstick (Automated) -     Urine Culture  Acute cystitis without hematuria  Other orders -     Ciprofloxacin  HCl; Take 1 tablet (500 mg total) by mouth 2 (two) times daily for 7 days.  Dispense: 14 tablet; Refill: 0  Assessment and Plan Assessment & Plan Urinary tract infection   She has a chronic urinary tract infection with symptoms persisting for two months, including dysuria, frequency, urgency, and oliguria. There is no fever or new back pain.. Given her age, colonization rather than active infection is possible. The differential includes bladder infection versus urinary tract infection. Culture results are pending for appropriate antibiotic selection. It was discussed that urinary tract infections can vary in duration and frequency, with one or two infections per year considered normal. Prescribe Cipro  and await culture results for antibiotic confirmation. Consider a urology referral if symptoms persist or recur frequently. Monitor symptoms and reassess if no improvement.  Elevated  lipase   She has mildly elevated lipase levels with a history of dilated pancreatic bile ducts but no current abdominal pain or symptoms of acute pancreatitis. Previous evaluations ruled out neoplasms. The etiology of lipase elevation is unclear, possibly related to an anatomical anomaly. Consult Dr. [name] for further evaluation and management.    Return if symptoms worsen or fail to improve.  Ellsworth Haas, MD

## 2023-07-07 NOTE — Patient Instructions (Signed)
 Cipro  sent  Worse, let me know

## 2023-07-09 ENCOUNTER — Ambulatory Visit: Payer: Self-pay | Admitting: Family Medicine

## 2023-07-09 LAB — URINE CULTURE
MICRO NUMBER:: 16495063
SPECIMEN QUALITY:: ADEQUATE

## 2023-07-09 NOTE — Progress Notes (Signed)
 Cipro  should cover  finish all 7 days.

## 2023-07-10 ENCOUNTER — Other Ambulatory Visit: Payer: Self-pay | Admitting: Family Medicine

## 2023-07-10 DIAGNOSIS — Z780 Asymptomatic menopausal state: Secondary | ICD-10-CM

## 2023-07-11 ENCOUNTER — Telehealth: Payer: Self-pay

## 2023-07-11 NOTE — Telephone Encounter (Signed)
 Pt called in regard to recent labs. Pt is requesting that she be able to get her Lipase and Liver function Panel done in 3 months.  Please review and advise

## 2023-07-27 ENCOUNTER — Ambulatory Visit
Admission: RE | Admit: 2023-07-27 | Discharge: 2023-07-27 | Disposition: A | Discharge status: Home / Self Care | Source: Ambulatory Visit | Attending: Family Medicine | Admitting: Family Medicine

## 2023-07-27 DIAGNOSIS — Z1231 Encounter for screening mammogram for malignant neoplasm of breast: Secondary | ICD-10-CM

## 2023-07-31 ENCOUNTER — Ambulatory Visit: Payer: Self-pay | Admitting: Family Medicine

## 2023-08-22 ENCOUNTER — Ambulatory Visit (INDEPENDENT_AMBULATORY_CARE_PROVIDER_SITE_OTHER): Payer: Medicare Other

## 2023-08-22 VITALS — Ht 62.0 in | Wt 115.0 lb

## 2023-08-22 DIAGNOSIS — Z0000 Encounter for general adult medical examination without abnormal findings: Secondary | ICD-10-CM

## 2023-08-22 NOTE — Progress Notes (Signed)
 Subjective:   Linda Mcmillan is a 73 y.o. who presents for a Medicare Wellness preventive visit.  As a reminder, Annual Wellness Visits don't include a physical exam, and some assessments may be limited, especially if this visit is performed virtually. We may recommend an in-person follow-up visit with your provider if needed.  Visit Complete: Virtual I connected with  Linda Mcmillan on 08/22/23 by a audio enabled telemedicine application and verified that I am speaking with the correct person using two identifiers.  Patient Location: Home  Provider Location: Office/Clinic  I discussed the limitations of evaluation and management by telemedicine. The patient expressed understanding and agreed to proceed.  Vital Signs: Because this visit was a virtual/telehealth visit, some criteria may be missing or patient reported. Any vitals not documented were not able to be obtained and vitals that have been documented are patient reported.  VideoDeclined- This patient declined Librarian, academic. Therefore the visit was completed with audio only.  Persons Participating in Visit: Patient.  AWV Questionnaire: Yes: Patient Medicare AWV questionnaire was completed by the patient on 08/21/23; I have confirmed that all information answered by patient is correct and no changes since this date.  Cardiac Risk Factors include: advanced age (>12men, >27 women);dyslipidemia     Objective:    Today's Vitals   08/22/23 1348  Weight: 115 lb (52.2 kg)  Height: 5' 2 (1.575 m)   Body mass index is 21.03 kg/m.     08/22/2023    1:52 PM 03/20/2023   10:51 AM 08/11/2022    3:01 PM 03/14/2022   10:48 AM 08/06/2021    2:24 PM 03/12/2021    8:59 AM  Advanced Directives  Does Patient Have a Medical Advance Directive? Yes Yes Yes Yes Yes Yes  Type of Estate agent of [place];Living will Living will Healthcare Power of [place];Living will Healthcare Power of  [place];Living will;Out of facility DNR (pink MOST or yellow form) Healthcare Power of [employer] of [place];Living will;Out of facility DNR (pink MOST or yellow form)  Copy of Healthcare Power of Attorney in Chart? No - copy requested  No - copy requested  No - copy requested     Current Medications (verified) Outpatient Encounter Medications as of 08/22/2023  Medication Sig   clonazePAM  (KLONOPIN ) 0.5 MG tablet Take 1 tablet (0.5 mg total) by mouth at bedtime.   estradiol  (ESTRACE ) 0.1 MG/GM vaginal cream INSERT 0.5 GRAMS INTRAVAGINALLY 3 TIMES A WEEL   gabapentin  (NEURONTIN ) 600 MG tablet Take 1 tablet at 6p and 8p.   rOPINIRole  (REQUIP ) 1 MG tablet Take 1 tablet at 6p   rosuvastatin  (CRESTOR ) 10 MG tablet Take 1 tablet (10 mg total) by mouth daily.   SYNTHROID  75 MCG tablet TAKE 1 TABLET BY MOUTH DAILY   No facility-administered encounter medications on file as of 08/22/2023.    Allergies (verified) Hydrocodone and Sulfa antibiotics   History: Past Medical History:  Diagnosis Date   Anxiety 2018   Arthritis Neck, back (herniated lumbar disc   Atherosclerosis    Cataract Surgery, 2020   Depression 2016   Hyperlipidemia 10 years   Neuromuscular disorder (HCC) Restless leg syndrome, 10 years   Restless leg syndrome   Thyroid  disease 15 years   hypothyroidism   Past Surgical History:  Procedure Laterality Date   ABDOMINAL HYSTERECTOMY  30 years ago   Partial   cataract surgery  2020   COLONOSCOPY  09/27/2021   [facility]  COSMETIC SURGERY  08/24   Chin/neck lift   EUS  07/09/2020   [facility]   EYE SURGERY Bilateral    RK, left eye nodule removed   RHYTIDECTOMY NECK / CHEEK / CHIN     TUBAL LIGATION     In my mid-30s   Family History  Problem Relation Age of Onset   Arthritis Mother    Cancer Mother        Colon Cancer at 60 years old   Diabetes Mother    Varicose Veins Mother    Colon cancer Mother    Alcohol abuse Father     Other Father        Heavy Smoker   Varicose Veins Maternal Grandmother    Other Son        PTSD from [place] War   ADD / ADHD Son    Alcohol abuse Son    Depression Son    Depression Son    Breast cancer Paternal Aunt    Rectal cancer Neg Hx    Stomach cancer Neg Hx    Pancreatic cancer Neg Hx    Esophageal cancer Neg Hx    Social History   Socioeconomic History   Marital status: Divorced    Spouse name: Not on file   Number of children: 2   Years of education: Not on file   Highest education level: Associate degree: academic program  Occupational History   Not on file  Tobacco Use   Smoking status: Never   Smokeless tobacco: Never  Vaping Use   Vaping status: Never Used  Substance and Sexual Activity   Alcohol use: Never   Drug use: Never   Sexual activity: Not Currently  Other Topics Concern   Not on file  Social History Narrative   Right Handed    Lives in a one story home   Social Drivers of Health   Financial Resource Strain: Low Risk  (08/21/2023)   Overall Financial Resource Strain (CARDIA)    Difficulty of Paying Living Expenses: Not hard at all  Food Insecurity: No Food Insecurity (08/21/2023)   Hunger Vital Sign    Worried About Running Out of Food in the Last Year: Never true    Ran Out of Food in the Last Year: Never true  Transportation Needs: No Transportation Needs (08/21/2023)   PRAPARE - Administrator, Civil Service (Medical): No    Lack of Transportation (Non-Medical): No  Physical Activity: Sufficiently Active (08/21/2023)   Exercise Vital Sign    Days of Exercise per Week: 4 days    Minutes of Exercise per Session: 150+ min  Stress: Stress Concern Present (08/21/2023)   [employer] of Occupational Health - Occupational Stress Questionnaire    Feeling of Stress: To some extent  Social Connections: Socially Isolated (08/21/2023)   Social Connection and Isolation Panel    Frequency of Communication with Friends and Family: Three times  a week    Frequency of Social Gatherings with Friends and Family: Twice a week    Attends Religious Services: Never    Database administrator or Organizations: No    Attends Engineer, structural: Not on file    Marital Status: Divorced    Tobacco Counseling Counseling given: Not Answered    Clinical Intake:  Pre-visit preparation completed: Yes  Pain : No/denies pain     BMI - recorded: 21.03 Nutritional Status: BMI of 19-24  Normal Nutritional Risks: None  Diabetes: No  Lab Results  Component Value Date   HGBA1C 5.8 12/14/2022   HGBA1C 6.0 11/01/2021   HGBA1C 5.9 03/30/2021     How often do you need to have someone help you when you read instructions, pamphlets, or other written materials from your doctor or pharmacy?: 1 - Never  Interpreter Needed?: No  Information entered by :: Ellouise Haws, LPN   Activities of Daily Living     08/21/2023   12:32 PM 08/16/2023    9:49 AM  In your present state of health, do you have any difficulty performing the following activities:  Hearing? 0 0  Vision? 0 0  Difficulty concentrating or making decisions? 0 0  Walking or climbing stairs? 0 0  Dressing or bathing? 0 0  Doing errands, shopping? 0 0  Preparing Food and eating ? N N  Using the Toilet? N N  In the past six months, have you accidently leaked urine? Y Y  Comment at times and wears a pad   Do you have problems with loss of bowel control? N N  Managing your Medications? N N  Managing your Finances? N N  Housekeeping or managing your Housekeeping? N N    Patient Care Team: Wendolyn Jenkins Jansky, MD as PCP - General (Family Medicine) Patel, Donika K, DO as Consulting Physician (Neurology)  I have updated your Care Teams any recent Medical Services you may have received from other providers in the past year.     Assessment:   This is a routine wellness examination for Linda Mcmillan.  Hearing/Vision screen Hearing Screening - Comments:: Pt denies any hearing  issues  Vision Screening - Comments:: Wears rx glasses - up to date with routine eye exams with [facility] in [place] [address] [place] and Dr Jefm    Goals Addressed             This Visit's Progress    Patient Stated       Aerobics and strengthen bones        Depression Screen     08/22/2023    1:51 PM 12/14/2022    9:39 AM 08/11/2022    2:59 PM 08/06/2021    2:22 PM 03/09/2021   11:15 AM  PHQ 2/9 Scores  PHQ - 2 Score 0 1 1 1 1   PHQ- 9 Score  1   1    Fall Risk     08/21/2023   12:32 PM 08/16/2023    9:49 AM 03/20/2023   10:50 AM 12/14/2022    9:39 AM 08/07/2022    9:29 AM  Fall Risk   Falls in the past year? 0 0 0 0 0  Number falls in past yr: 0 0 0 0   Injury with Fall?   0 0   Risk for fall due to : No Fall Risks   No Fall Risks Impaired vision  Follow up Falls prevention discussed  Falls evaluation completed Falls evaluation completed Falls prevention discussed    MEDICARE RISK AT HOME:  Medicare Risk at Home Any stairs in or around the home?: (Patient-Rptd) No Home free of loose throw rugs in walkways, pet beds, electrical cords, etc?: (Patient-Rptd) Yes Adequate lighting in your home to reduce risk of falls?: (Patient-Rptd) Yes Life alert?: (Patient-Rptd) No Use of a cane, walker or w/c?: (Patient-Rptd) No Grab bars in the bathroom?: (Patient-Rptd) No Shower chair or bench in shower?: (Patient-Rptd) No Elevated toilet seat or a handicapped toilet?: (Patient-Rptd) No  TIMED  UP AND GO:  Was the test performed?  No  Cognitive Function: 6CIT completed        08/22/2023    1:54 PM 08/11/2022    3:03 PM 08/06/2021    2:27 PM  6CIT Screen  What Year? 0 points 0 points 0 points  What month? 0 points 0 points 0 points  What time? 0 points 0 points 0 points  Count back from 20 0 points 0 points 0 points  Months in reverse 0 points 0 points 0 points  Repeat phrase 0 points 0 points 0 points  Total Score 0 points 0 points 0 points     Immunizations Immunization History  Administered Date(s) Administered   Fluad Quad(high Dose 65+) 11/01/2021   Influenza, High Dose Seasonal PF 11/16/2022   Influenza-Unspecified 11/20/2020   PFIZER(Purple Top)SARS-COV-2 Vaccination 03/12/2019, 04/03/2019, 11/11/2019, 12/08/2020   Pfizer(Comirnaty )Fall Seasonal Vaccine 12 years and older 12/27/2021, 07/06/2022, 11/09/2022   Rsv, Bivalent, Protein Subunit Rsvpref,pf [place]) 11/05/2021    Screening Tests Health Maintenance  Topic Date Due   COVID-19 Vaccine (8 - 2024-25 season) 05/09/2023   Pneumococcal Vaccine: 50+ Years (1 of 1 - PCV) 10/02/2023 (Originally 06/03/1998)   Zoster Vaccines- Shingrix (1 of 2) 10/03/2023 (Originally 06/03/1998)   DTaP/Tdap/Td (1 - Tdap) 02/16/2024 (Originally [birth year]1967)   INFLUENZA VACCINE  09/15/2023   Medicare Annual Wellness (AWV)  08/21/2024   DEXA SCAN  Completed   Hepatitis C Screening  Completed   Hepatitis B Vaccines  Aged Out   HPV VACCINES  Aged Out   Meningococcal B Vaccine  Aged Out   Colonoscopy  Discontinued    Health Maintenance  Health Maintenance Due  Topic Date Due   COVID-19 Vaccine (8 - 2024-25 season) 05/09/2023   Health Maintenance Items Addressed: See Nurse Notes at the end of this note  Additional Screening:  Vision Screening: Recommended annual ophthalmology exams for early detection of glaucoma and other disorders of the eye. Would you like a referral to an eye doctor? No    Dental Screening: Recommended annual dental exams for proper oral hygiene  Community Resource Referral / Chronic Care Management: CRR required this visit?  No   CCM required this visit?  No   Plan:    I have personally reviewed and noted the following in the patient's chart:   Medical and social history Use of alcohol, tobacco or illicit drugs  Current medications and supplements including opioid prescriptions. Patient is not currently taking opioid prescriptions. Functional  ability and status Nutritional status Physical activity Advanced directives List of other physicians Hospitalizations, surgeries, and ER visits in previous 12 months Vitals Screenings to include cognitive, depression, and falls Referrals and appointments  In addition, I have reviewed and discussed with patient certain preventive protocols, quality metrics, and best practice recommendations. A written personalized care plan for preventive services as well as general preventive health recommendations were provided to patient.   Ellouise VEAR Haws, LPN   03/17/7972   After Visit Summary: (MyChart) Due to this being a telephonic visit, the after visit summary with patients personalized plan was offered to patient via MyChart   Notes: Nothing significant to report at this time.

## 2023-08-22 NOTE — Patient Instructions (Signed)
 Linda Mcmillan , Thank you for taking time out of your busy schedule to complete your Annual Wellness Visit with me. I enjoyed our conversation and look forward to speaking with you again next year. I, as well as your care team,  appreciate your ongoing commitment to your health goals. Please review the following plan we discussed and let me know if I can assist you in the future. Your Game plan/ To Do List    Referrals: If you haven't heard from the office you've been referred to, please reach out to them at the phone provided.   Follow up Visits: Next Medicare AWV with our clinical staff: 08/27/24   Have you seen your provider in the last 6 months (3 months if uncontrolled diabetes)? Yes Next Office Visit with your provider: will schedule appt   Clinician Recommendations:  Aim for 30 minutes of exercise or brisk walking, 6-8 glasses of water, and 5 servings of fruits and vegetables each day.       This is a list of the screening recommended for you and due dates:  Health Maintenance  Topic Date Due   COVID-19 Vaccine (8 - 2024-25 season) 05/09/2023   Pneumococcal Vaccine for age over 55 (1 of 1 - PCV) 10/02/2023*   Zoster (Shingles) Vaccine (1 of 2) 10/03/2023*   DTaP/Tdap/Td vaccine (1 - Tdap) 02/16/2024*   Flu Shot  09/15/2023   Medicare Annual Wellness Visit  08/21/2024   DEXA scan (bone density measurement)  Completed   Hepatitis C Screening  Completed   Hepatitis B Vaccine  Aged Out   HPV Vaccine  Aged Out   Meningitis B Vaccine  Aged Out   Colon Cancer Screening  Discontinued  *Topic was postponed. The date shown is not the original due date.    Advanced directives: (Copy Requested) Please bring a copy of your health care power of attorney and living will to the office to be added to your chart at your convenience. You can mail to [facility] [address] [place], [place] [address] or email to [email or web address] Advance Care Planning is important because  it:  [x]  Makes sure you receive the medical care that is consistent with your values, goals, and preferences  [x]  It provides guidance to your family and loved ones and reduces their decisional burden about whether or not they are making the right decisions based on your wishes.  Follow the link provided in your after visit summary or read over the paperwork we have mailed to you to help you started getting your Advance Directives in place. If you need assistance in completing these, please reach out to us  so that we can help you!  See attachments for Preventive Care and Fall Prevention Tips.

## 2023-09-01 ENCOUNTER — Telehealth: Payer: Self-pay

## 2023-09-01 ENCOUNTER — Other Ambulatory Visit: Payer: Self-pay

## 2023-09-01 ENCOUNTER — Ambulatory Visit: Admitting: Family Medicine

## 2023-09-01 VITALS — BP 102/60 | HR 85 | Ht 62.0 in | Wt 119.6 lb

## 2023-09-01 DIAGNOSIS — M1711 Unilateral primary osteoarthritis, right knee: Secondary | ICD-10-CM | POA: Diagnosis not present

## 2023-09-01 DIAGNOSIS — G8929 Other chronic pain: Secondary | ICD-10-CM

## 2023-09-01 DIAGNOSIS — M25561 Pain in right knee: Secondary | ICD-10-CM

## 2023-09-01 NOTE — Patient Instructions (Addendum)
 Thank you for coming in today.   We will work to authorize Zilretta   Schedule a visit for next week

## 2023-09-01 NOTE — Progress Notes (Signed)
   I, [name] am a scribe for Dr. Artist Lloyd, MD.  Linda Mcmillan is a 73 y.o. female who presents to [facility] at [facility] today for exacerbation of her R knee pain. Pt was last seen by Dr. Lloyd on 06/13/23 and was given a R knee Zilretta  injection.  Today, pt reports can feel the right knee today. The knee was great until she went to the gym after her [place] vacation. It has been hurting since then.   Dx imaging: 04/19/23 R knee XR   Pertinent review of systems: No fevers or chills  Relevant historical information: Hypothyroidism   Exam:  BP 102/60   Pulse 85   Ht 5' 2 (1.575 m)   Wt 119 lb 9.6 oz (54.3 kg)   SpO2 96%   BMI 21.88 kg/m  General: Well Developed, well nourished, and in no acute distress.   MSK: Right knee minimal effusion.  Tender palpation medial joint line.  Normal motion.      Assessment and Plan: 73 y.o. 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  Would like to repeat Zilretta  injection as it worked quite well.  We can do this injection on or after September 06, 2023.  That would be 12 weeks and 1 day since the last injection.  She will schedule with me next Friday the 25th.  Will work on getting Zilretta  approved and do that injection then.  If that does not work next step should be an MRI of the knee.   PDMP not reviewed this encounter. Orders Placed This Encounter  Procedures   US  LIMITED JOINT SPACE STRUCTURES LOW RIGHT(NO LINKED CHARGES)    Reason for Exam (SYMPTOM  OR DIAGNOSIS REQUIRED):   knee pain    Preferred imaging location?:   [facility]   No orders of the defined types were placed in this encounter.    Discussed warning signs or symptoms. Please see discharge instructions. Patient expresses understanding.   The above documentation has been reviewed and is accurate and complete Artist Lloyd, M.D.

## 2023-09-01 NOTE — Telephone Encounter (Signed)
 Please re-auth Zilretta , RIGHT knee

## 2023-09-05 NOTE — Telephone Encounter (Signed)
 Ran benefits case ID [id number]

## 2023-09-07 NOTE — Telephone Encounter (Signed)
 Scheduled 09/08/23  Zilretta  authorized for right knee MEDICARE Coinsurance 80% Deductible $257 has met $257 OOP does not apply NO PA REQUIRED Reference # Website 09/05/23  [employer]ARP Covers the 20% medicare coinsurance NO PA REQUIRED

## 2023-09-08 ENCOUNTER — Other Ambulatory Visit: Payer: Self-pay

## 2023-09-08 ENCOUNTER — Ambulatory Visit: Admitting: Family Medicine

## 2023-09-08 DIAGNOSIS — M1711 Unilateral primary osteoarthritis, right knee: Secondary | ICD-10-CM

## 2023-09-08 MED ORDER — TRIAMCINOLONE ACETONIDE 32 MG IX SRER
32.0000 mg | Freq: Once | INTRA_ARTICULAR | Status: AC
  Administered 2023-09-08: 32 mg via INTRA_ARTICULAR

## 2023-09-08 NOTE — Progress Notes (Signed)
  Zilretta  injection right knee Procedure: Real-time Ultrasound Guided Injection of right knee joint superior lateral patellar space Device: Philips Affiniti 50G Images permanently stored and available for review in PACS Verbal informed consent obtained.  Discussed risks and benefits of procedure. Warned about infection, hyperglycemia bleeding, damage to structures among others. Patient expresses understanding and agreement Time-out conducted.   Noted no overlying erythema, induration, or other signs of local infection.   Skin prepped in a sterile fashion.   Local anesthesia: Topical Ethyl chloride.   With sterile technique and under real time ultrasound guidance: Zilretta  32 mg injected into knee joint. Fluid seen entering the joint capsule.   Completed without difficulty   Advised to call if fevers/chills, erythema, induration, drainage, or persistent bleeding.   Images permanently stored and available for review in the ultrasound unit.  Impression: Technically successful ultrasound guided injection.  Lot number: [id number]

## 2023-09-08 NOTE — Patient Instructions (Signed)
 Thank you for coming in today.   You received an injection today. Seek immediate medical attention if the joint becomes red, extremely painful, or is oozing fluid.   Let us  know if your symptoms aren't better and we can order a MRI.

## 2023-09-19 ENCOUNTER — Encounter: Payer: Self-pay | Admitting: Neurology

## 2023-09-21 ENCOUNTER — Telehealth: Payer: Self-pay | Admitting: Family Medicine

## 2023-09-21 DIAGNOSIS — G8929 Other chronic pain: Secondary | ICD-10-CM

## 2023-09-21 DIAGNOSIS — M1711 Unilateral primary osteoarthritis, right knee: Secondary | ICD-10-CM

## 2023-09-21 NOTE — Telephone Encounter (Signed)
 Patient called and stated that her first injection lasted a long time, second hasn't lasted 2 weeks and she is in some pain. She is asking if she can go ahead and get MRI scheduled so that Dr. Joane can look at it when he gets back. Please advise.

## 2023-09-26 ENCOUNTER — Other Ambulatory Visit: Payer: Self-pay | Admitting: Neurology

## 2023-09-26 DIAGNOSIS — G2581 Restless legs syndrome: Secondary | ICD-10-CM

## 2023-09-27 ENCOUNTER — Encounter: Payer: Self-pay | Admitting: Neurology

## 2023-10-08 ENCOUNTER — Ambulatory Visit
Admission: RE | Admit: 2023-10-08 | Discharge: 2023-10-08 | Disposition: A | Discharge status: Home / Self Care | Source: Ambulatory Visit | Attending: Family Medicine | Admitting: Family Medicine

## 2023-10-08 DIAGNOSIS — G8929 Other chronic pain: Secondary | ICD-10-CM

## 2023-10-10 ENCOUNTER — Ambulatory Visit: Payer: Self-pay | Admitting: Family Medicine

## 2023-10-10 NOTE — Progress Notes (Signed)
 Right knee MRI does show some mild arthritis.  There is also a small Baker's cyst and possibly a small meniscus tear.  Overall the knee MRI looks pretty okay. Recommend return to clinic to go over the results of full detail and discuss treatment plan and options.

## 2023-10-11 ENCOUNTER — Ambulatory Visit: Admitting: Family Medicine

## 2023-10-11 VITALS — BP 118/60 | HR 77 | Ht 62.0 in | Wt 119.6 lb

## 2023-10-11 DIAGNOSIS — M25561 Pain in right knee: Secondary | ICD-10-CM | POA: Diagnosis not present

## 2023-10-11 DIAGNOSIS — M1711 Unilateral primary osteoarthritis, right knee: Secondary | ICD-10-CM

## 2023-10-11 DIAGNOSIS — G8929 Other chronic pain: Secondary | ICD-10-CM | POA: Diagnosis not present

## 2023-10-11 DIAGNOSIS — S83241A Other tear of medial meniscus, current injury, right knee, initial encounter: Secondary | ICD-10-CM | POA: Diagnosis not present

## 2023-10-11 NOTE — Patient Instructions (Signed)
 Thank you for coming in today. Referral has been sent to Dr. Genelle at [facility]. They should be giving you a call soon.

## 2023-10-11 NOTE — Progress Notes (Signed)
   I, [name] am a scribe for Dr. Artist Lloyd, MD.  Linda Mcmillan is a 73 y.o. female who presents to [facility] at [facility] today for cont'd R knee pain w/ MRI review. Pt was last seen by Dr. Lloyd on 09/08/23 and her L knee was injected w/ Zilretta .  Pt later called the office c/o only 2 wks of relief and a MRI was ordered.  Today, pt reports that she is just here to go over the MRI and discuss a treatment plan.   She notes a feeling of instability in her knee at times especially when she is walking.  Dx imaging: 10/08/23 R knee MRI  04/19/23 R knee XR  Pertinent review of systems: No fevers or chills  Relevant historical information: Aortic atherosclerosis.   Exam:  BP 118/60   Pulse 77   Ht 5' 2 (1.575 m)   Wt 119 lb 9.6 oz (54.3 kg)   SpO2 97%   BMI 21.88 kg/m  General: Well Developed, well nourished, and in no acute distress.   MSK: Right knee normal-appearing normal motion.    Lab and Radiology Results No results found for this or any previous visit (from the past 72 hours). MR Knee Right Wo Contrast Result Date: 10/09/2023 MR KNEE WITHOUT IV CONTRAST RIGHT COMPARISON: X-ray 04/19/2023 CLINICAL HISTORY: Right knee pain. PULSE SEQUENCES: Ax PD FS, Sag T2 ACL, Sag PD FS, Cor PD FS & COR T1 FINDINGS: Bones: There is no fracture or contusion pattern. There is a small reactive joint effusion and a small Baker's cyst. Extensor mechanism is intact. Ligaments: The ACL, PCL, MCL and fibular collateral ligament are intact. Menisci: Lateral meniscus is unremarkable. There is a suggestion of a partial apical tear of the posterior horn of the medial meniscus. This is of uncertain clinical significance. There may be a small flipped fragment seen on coronal image 12 of series 106. IMPRESSION: Mild degenerative arthrosis. Small Baker's cyst. Suggestion of a small apical partial tear of the posterior horn the medial meniscus with a possible small centrally displaced  fragment as above. This is of uncertain clinical significance. Correlation for medial joint line symptoms. Electronically signed by: Norleen Satchel MD 10/09/2023 04:31 PM EDT RP Workstation: [id number]  LILLETTE Artist Lloyd, personally (independently) visualized and performed the interpretation of the images attached in this note.      Assessment and Plan: 73 y.o. female with right knee pain with some instability sensation due to posterior medial meniscus tear and a Baker's cyst.  She is already had several steroid injections and is very active.  Plan for referral to orthopedic surgery to discuss her surgical options.  Consider trial of physical therapy as well.   PDMP not reviewed this encounter. Orders Placed This Encounter  Procedures   Ambulatory referral to Orthopedic Surgery    Referral Priority:   Routine    Referral Type:   Surgical    Referral Reason:   Specialty Services Required    Referred to Provider:   Genelle Standing, MD    Requested Specialty:   Orthopedic Surgery    Number of Visits Requested:   1   No orders of the defined types were placed in this encounter.    Discussed warning signs or symptoms. Please see discharge instructions. Patient expresses understanding.   The above documentation has been reviewed and is accurate and complete Artist Lloyd, M.D.

## 2023-10-30 ENCOUNTER — Telehealth: Payer: Self-pay

## 2023-10-30 NOTE — Telephone Encounter (Signed)
 Yes, pt should fast and choose an early appointment. Please schedule this for pt.   Copied from CRM #[id number]. Topic: Clinical - Medical Advice >> Oct 30, 2023 11:12 AM Corin V wrote: Reason for CRM: Patient is scheduled 9/29 for an OV for various symptoms and is wanting to know if Dr. Wendolyn thinks fasting labs would be ordered. If so please let patient know so she can choose a morning apt desired. (Symptoms: blood work for hormones, possible thyroid  check, feel out of balance, fatigue, thinning hair)  Patient also wants to make sure appropriate thyroid  labs would be ordered as she has a history of thyroid  issues and mentioned the Free T4 possibly being an additional lab to order and is open to discussing additional labs not covered by Medicare if needed to ensure complete testing is done.

## 2023-11-06 ENCOUNTER — Ambulatory Visit (HOSPITAL_BASED_OUTPATIENT_CLINIC_OR_DEPARTMENT_OTHER): Admitting: Orthopaedic Surgery

## 2023-11-06 DIAGNOSIS — S838X1S Sprain of other specified parts of right knee, sequela: Secondary | ICD-10-CM

## 2023-11-06 NOTE — Progress Notes (Signed)
 Chief Complaint: Right knee pain     History of Present Illness:    Linda Mcmillan is a 73 y.o. female presents today with ongoing right knee pain particular about the medial joint line.  This is more as she describes the achiness as opposed to pain.  This is about the medial tibiofemoral joint space.  There is an occasional giving out where the knee does buckle as well.  She has been seeing Dr. Joane who has performed 2 injections.  She did get some relief from these.  This is subsequently worn off.  She is moving to [place]  in the upcoming weeks.    PMH/PSH/Family History/Social History/Meds/Allergies:    Past Medical History:  Diagnosis Date   Anxiety 2018   Arthritis Neck, back (herniated lumbar disc   Atherosclerosis    Cataract Surgery, 2020   Depression 2016   Hyperlipidemia 10 years   Neuromuscular disorder (HCC) Restless leg syndrome, 10 years   Restless leg syndrome   Thyroid  disease 15 years   hypothyroidism   Past Surgical History:  Procedure Laterality Date   ABDOMINAL HYSTERECTOMY  30 years ago   Partial   cataract surgery  2020   COLONOSCOPY  09/27/2021   [facility]   COSMETIC SURGERY  08/24   Chin/neck lift   EUS  07/09/2020   [facility]   EYE SURGERY Bilateral    RK, left eye nodule removed   RHYTIDECTOMY NECK / CHEEK / CHIN     TUBAL LIGATION     In my mid-30s   Social History   Socioeconomic History   Marital status: Divorced    Spouse name: Not on file   Number of children: 2   Years of education: Not on file   Highest education level: Associate degree: academic program  Occupational History   Not on file  Tobacco Use   Smoking status: Never   Smokeless tobacco: Never  Vaping Use   Vaping status: Never Used  Substance and Sexual Activity   Alcohol use: Never   Drug use: Never   Sexual activity: Not Currently  Other Topics Concern   Not on file  Social History Narrative   Right Handed    Lives in a one story  home   Social Drivers of Health   Financial Resource Strain: Low Risk  (08/21/2023)   Overall Financial Resource Strain (CARDIA)    Difficulty of Paying Living Expenses: Not hard at all  Food Insecurity: No Food Insecurity (08/21/2023)   Hunger Vital Sign    Worried About Running Out of Food in the Last Year: Never true    Ran Out of Food in the Last Year: Never true  Transportation Needs: No Transportation Needs (08/21/2023)   PRAPARE - Administrator, Civil Service (Medical): No    Lack of Transportation (Non-Medical): No  Physical Activity: Sufficiently Active (08/21/2023)   Exercise Vital Sign    Days of Exercise per Week: 4 days    Minutes of Exercise per Session: 150+ min  Stress: Stress Concern Present (08/21/2023)   [employer] of Occupational Health - Occupational Stress Questionnaire    Feeling of Stress: To some extent  Social Connections: Socially Isolated (08/21/2023)   Social Connection and Isolation Panel    Frequency of Communication with Friends and Family: Three times a week    Frequency of Social Gatherings with Friends and Family: Twice a week    Attends Religious Services: Never  Active Member of Clubs or Organizations: No    Attends Engineer, structural: Not on file    Marital Status: Divorced   Family History  Problem Relation Age of Onset   Arthritis Mother    Cancer Mother        Colon Cancer at 83 years old   Diabetes Mother    Varicose Veins Mother    Colon cancer Mother    Alcohol abuse Father    Other Father        Heavy Smoker   Varicose Veins Maternal Grandmother    Other Son        PTSD from [place] War   ADD / ADHD Son    Alcohol abuse Son    Depression Son    Depression Son    Breast cancer Paternal Aunt    Rectal cancer Neg Hx    Stomach cancer Neg Hx    Pancreatic cancer Neg Hx    Esophageal cancer Neg Hx    Allergies  Allergen Reactions   Hydrocodone Nausea And Vomiting and Nausea Only   Sulfa Antibiotics  Nausea Only and Rash    Other reaction(s): Headache   Current Outpatient Medications  Medication Sig Dispense Refill   clonazePAM  (KLONOPIN ) 0.5 MG tablet TAKE 1 TABLET BY MOUTH AT BEDTIME 90 tablet 1   estradiol  (ESTRACE ) 0.1 MG/GM vaginal cream INSERT 0.5 GRAMS INTRAVAGINALLY 3 TIMES A WEEL 42.5 g 2   gabapentin  (NEURONTIN ) 600 MG tablet Take 1 tablet at 6p and 8p. 180 tablet 3   rOPINIRole  (REQUIP ) 1 MG tablet Take 1 tablet at 6p 90 tablet 3   rosuvastatin  (CRESTOR ) 10 MG tablet Take 1 tablet (10 mg total) by mouth daily. 90 tablet 3   SYNTHROID  75 MCG tablet TAKE 1 TABLET BY MOUTH DAILY 90 tablet 3   No current facility-administered medications for this visit.   No results found.  Review of Systems:   A ROS was performed including pertinent positives and negatives as documented in the HPI.  Physical Exam :   Constitutional: NAD and appears stated age Neurological: Alert and oriented Psych: Appropriate affect and cooperative There were no vitals taken for this visit.   Comprehensive Musculoskeletal Exam:    Right knee tenderness to palpation about the posterior medial joint space with a Baker's cyst, positive McMurray range of motion is from 0 to 130 degrees negative lateral joint line tenderness negative Lachman negative posterior drawer no varus valgus laxity   Imaging:   Xray (3 views right knee): Normal  MRI (right knee): There is a posterior medial meniscal tear involving the posterior third without root involvement and minimal meniscal extrusion   I personally reviewed and interpreted the radiographs.   Assessment and Plan:   73 y.o. female with a posterior third medial meniscal tear.  I did describe that overall I do not believe that the root is destabilized and there is minimal meniscal extrusion without any chondral wear.  Given this we did discuss that she does have treatment options.  Her predominant complaint is buckling of the knee and to this effect I really  do believe she would benefit from a hip and quad strengthening program to help her work through the gait cycle.  She would like to start with this.  I did briefly discuss the role for possible arthroscopic intervention although I did also counsel her on avoidance of deep activities like bending beyond 90 degrees that could ultimately flare up her posterior third  meniscal injury on the medial meniscus.  At this time she would like to start with physical therapy which I do believe is a reasonable intervention.  I will plan to see her back in 6 weeks to assess her progress   I personally saw and evaluated the patient, and participated in the management and treatment plan.  Elspeth Parker, MD Attending Physician, Orthopedic Surgery  This document was dictated using Dragon voice recognition software. A reasonable attempt at proof reading has been made to minimize errors.

## 2023-11-10 ENCOUNTER — Ambulatory Visit: Admitting: Family Medicine

## 2023-11-10 ENCOUNTER — Encounter: Payer: Self-pay | Admitting: Family Medicine

## 2023-11-10 VITALS — BP 98/60 | HR 69 | Temp 97.9°F | Ht 62.0 in | Wt 118.0 lb

## 2023-11-10 DIAGNOSIS — Z23 Encounter for immunization: Secondary | ICD-10-CM | POA: Diagnosis not present

## 2023-11-10 DIAGNOSIS — E782 Mixed hyperlipidemia: Secondary | ICD-10-CM | POA: Diagnosis not present

## 2023-11-10 DIAGNOSIS — F4321 Adjustment disorder with depressed mood: Secondary | ICD-10-CM | POA: Diagnosis not present

## 2023-11-10 DIAGNOSIS — R7303 Prediabetes: Secondary | ICD-10-CM

## 2023-11-10 DIAGNOSIS — R5383 Other fatigue: Secondary | ICD-10-CM

## 2023-11-10 DIAGNOSIS — E039 Hypothyroidism, unspecified: Secondary | ICD-10-CM | POA: Diagnosis not present

## 2023-11-10 DIAGNOSIS — L659 Nonscarring hair loss, unspecified: Secondary | ICD-10-CM

## 2023-11-10 DIAGNOSIS — G2581 Restless legs syndrome: Secondary | ICD-10-CM

## 2023-11-10 NOTE — Patient Instructions (Addendum)
 It was very nice to see you today!  Biotin for hair and nails.     PLEASE NOTE:  If you had any lab tests please let us  know if you have not heard back within a few days. You may see your results on MyChart before we have a chance to review them but we will give you a call once they are reviewed by us . If we ordered any referrals today, please let us  know if you have not heard from their office within the next week.   Please try these tips to maintain a healthy lifestyle:  Eat most of your calories during the day when you are active. Eliminate processed foods including packaged sweets (pies, cakes, cookies), reduce intake of potatoes, white bread, white pasta, and white rice. Look for whole grain options, oat flour or almond flour.  Each meal should contain half fruits/vegetables, one quarter protein, and one quarter carbs (no bigger than a computer mouse).  Cut down on sweet beverages. This includes juice, soda, and sweet tea. Also watch fruit intake, though this is a healthier sweet option, it still contains natural sugar! Limit to 3 servings daily.  Drink at least 1 glass of water with each meal and aim for at least 8 glasses per day  Exercise at least 150 minutes every week.

## 2023-11-10 NOTE — Progress Notes (Signed)
 Subjective:     Patient ID: Linda Mcmillan, female    DOB: 04-08-1950, 73 y.o.   MRN: [id number]  Chief Complaint  Patient presents with   Hair/Scalp Problem    Hair is coming out in clumps when I shower, comb, or dry it; x38mo   Fatigue    Tired and emotional think its related to thyroid  issues   Medication Problem    Synthroid ; would this be a good time to switch to levothyroxine  if thyroid  is out of balance?    HPI Discussed the use of AI scribe software for clinical note transcription with the patient, who gave verbal consent to proceed.  History of Present Illness Linda Mcmillan is a 73 year old female with hypothyroidism who presents with hair loss and fatigue.  She has been experiencing significant hair loss over the past two months, describing it as falling out in clumps, particularly noticeable when washing her hair. The hair loss is diffuse and not localized to any specific area, with hair accumulating in the shower drain and around her home. There have been no recent changes in her medications, which include Synthroid  for her thyroid  condition. She mentions taking a multivitamin containing biotin and is concerned about the impact of her thyroid  medication on her symptoms. No surgeries or procedures.  Has colored her hair for decades  She reports feeling listless, fatigued, and emotionally out of balance, which she suspects may be related to hormonal changes or her thyroid  condition. She has been experiencing ongoing stress related to family issues, including her son's health problems and a strained relationship with her youngest son, causing significant emotional distress. She maintains a healthy lifestyle, including regular gym visits and a balanced diet.  Will be moving back to [place] within a few months.  Her past medical history includes hypothyroidism, for which she takes  branded Synthroid , and prediabetes, which she manages with regular exercise and a diet low in sugar. She has  been taking probiotics to prevent UTIs and reports not having had a UTI for a while. She also mentions knee problems for which she has received cortisone injections, though they have not been very effective.  HLD-has been on crestor  10mg  long time.   No chest pain, shortness of breath, coughing, wheezing, or vomiting. She reports occasional throat congestion and dry, brittle hair.    Health Maintenance Due  Topic Date Due   Zoster Vaccines- Shingrix (1 of 2) Never done    Past Medical History:  Diagnosis Date   Anxiety 2018   Arthritis Neck, back (herniated lumbar disc   Atherosclerosis    Cataract Surgery, 2020   Depression 2016   Hyperlipidemia 10 years   Neuromuscular disorder (HCC) Restless leg syndrome, 10 years   Restless leg syndrome   Thyroid  disease 15 years   hypothyroidism    Past Surgical History:  Procedure Laterality Date   ABDOMINAL HYSTERECTOMY  30 years ago   Partial   cataract surgery  2020   COLONOSCOPY  09/27/2021   [facility]   COSMETIC SURGERY  08/24   Chin/neck lift   [facility]  07/09/2020   [facility]   EYE SURGERY Bilateral    RK, left eye nodule removed   RHYTIDECTOMY NECK / CHEEK / CHIN     TUBAL LIGATION     In my mid-30s     Current Outpatient Medications:    clonazePAM  (KLONOPIN ) 0.5 MG tablet, TAKE 1 TABLET BY MOUTH AT BEDTIME, Disp: 90  tablet, Rfl: 1   estradiol  (ESTRACE ) 0.1 MG/GM vaginal cream, INSERT 0.5 GRAMS INTRAVAGINALLY 3 TIMES A WEEL, Disp: 42.5 g, Rfl: 2   gabapentin  (NEURONTIN ) 600 MG tablet, Take 1 tablet at 6p and 8p., Disp: 180 tablet, Rfl: 3   rOPINIRole  (REQUIP ) 1 MG tablet, Take 1 tablet at 6p, Disp: 90 tablet, Rfl: 3   rosuvastatin  (CRESTOR ) 10 MG tablet, Take 1 tablet (10 mg total) by mouth daily., Disp: 90 tablet, Rfl: 3   SYNTHROID  75 MCG tablet, TAKE 1 TABLET BY MOUTH DAILY, Disp: 90 tablet, Rfl: 3  Allergies  Allergen Reactions   Hydrocodone Nausea And Vomiting and Nausea Only   Sulfa Antibiotics  Nausea Only and Rash    Other reaction(s): Headache   ROS neg/noncontributory except as noted HPI/below      Objective:     BP 98/60   Pulse 69   Temp 97.9 F (36.6 C)   Ht 5' 2 (1.575 m)   Wt 118 lb (53.5 kg)   SpO2 95%   BMI 21.58 kg/m  Wt Readings from Last 3 Encounters:  11/10/23 118 lb (53.5 kg)  10/11/23 119 lb 9.6 oz (54.3 kg)  09/01/23 119 lb 9.6 oz (54.3 kg)    Physical Exam   Gen: WDWN NAD HEENT: NCAT, conjunctiva not injected, sclera nonicteric NECK:  supple, no thyromegaly, no nodes, no carotid bruits CARDIAC: RRR, S1S2+, no murmur. DP 2+B LUNGS: CTAB. No wheezes ABDOMEN:  BS+, soft, NTND, No HSM, no masses EXT:  no edema MSK: no gross abnormalities.  NEURO: A&O x3.  CN II-XII intact.  PSYCH: normal mood. Good eye contact  Hair pull-1 strand each area.     Assessment & Plan:  Acquired hypothyroidism -     T3, free; Future -     T4, free; Future -     TSH; Future  Adjustment disorder with depressed mood  Mixed hyperlipidemia -     Comprehensive metabolic panel with GFR; Future -     Lipid panel; Future  Prediabetes -     Comprehensive metabolic panel with GFR; Future -     Hemoglobin A1c; Future  Alopecia -     CBC with Differential/Platelet; Future -     Comprehensive metabolic panel with GFR; Future -     Magnesium; Future -     IBC + Ferritin; Future -     Vitamin B12; Future  Other fatigue -     CBC with Differential/Platelet; Future -     Comprehensive metabolic panel with GFR; Future  RLS (restless legs syndrome)  Flu vaccine need -     Flu vaccine HIGH DOSE PF(Fluzone Trivalent)  Encounter for Prevnar pneumococcal vaccination -     Pneumococcal conjugate vaccine 20-valent  Assessment and Plan Assessment & Plan Hair loss and associated fatigue   She experiences diffuse hair loss for two months, accompanied by fatigue and emotional changes. Possible causes include thyroid  dysfunction, anemia, or stress, with ongoing  family-related stress potentially contributing. A hormonal imbalance or thyroid  issue is suspected. Order TSH, free T3, and free T4 tests. Advise stopping multivitamin containing biotin for a few days before the blood test. Schedule follow-up blood test on Wednesday.  Hypothyroidism   Currently managed with branded Synthroid , she is considering switching to levothyroxine  due to cost. Await thyroid  function test results before making any medication changes. Will switch to generic when results are back to determine any dose changes.  Then will need repeat thyroid  tsh,ft3,ft4 6 wks  after change.  Prediabetes   She actively manages prediabetes with lifestyle modifications, including regular exercise and a healthy diet. She engages in strength training and maintains a diet low in sugar.  Hyperlipidemia -chronic. controlled  Her hyperlipidemia is managed with Crestor  10mg .  Adjustment disorder-doing ok w/o meds  RLS-stable on meds-managed by neuro  General Health Maintenance   She received flu and pneumonia vaccinations. She takes a calcium  supplement with vitamin D, but Medicare does not cover vitamin D level testing. Document flu and pneumonia vaccinations in medical records.    Return for wednesday for fasting labs,  then labs in 6 wks.  Jenkins CHRISTELLA Carrel, MD

## 2023-11-12 DIAGNOSIS — G2581 Restless legs syndrome: Secondary | ICD-10-CM | POA: Insufficient documentation

## 2023-11-13 ENCOUNTER — Ambulatory Visit: Admitting: Family Medicine

## 2023-11-15 ENCOUNTER — Other Ambulatory Visit (INDEPENDENT_AMBULATORY_CARE_PROVIDER_SITE_OTHER)

## 2023-11-15 DIAGNOSIS — E782 Mixed hyperlipidemia: Secondary | ICD-10-CM

## 2023-11-15 DIAGNOSIS — R7303 Prediabetes: Secondary | ICD-10-CM | POA: Diagnosis not present

## 2023-11-15 DIAGNOSIS — E039 Hypothyroidism, unspecified: Secondary | ICD-10-CM | POA: Diagnosis not present

## 2023-11-15 DIAGNOSIS — L659 Nonscarring hair loss, unspecified: Secondary | ICD-10-CM

## 2023-11-15 DIAGNOSIS — R5383 Other fatigue: Secondary | ICD-10-CM

## 2023-11-15 LAB — CBC WITH DIFFERENTIAL/PLATELET
Basophils Absolute: 0 K/uL (ref 0.0–0.1)
Basophils Relative: 0.9 % (ref 0.0–3.0)
Eosinophils Absolute: 0.2 K/uL (ref 0.0–0.7)
Eosinophils Relative: 4.7 % (ref 0.0–5.0)
HCT: 42.4 % (ref 36.0–46.0)
Hemoglobin: 14.1 g/dL (ref 12.0–15.0)
Lymphocytes Relative: 31 % (ref 12.0–46.0)
Lymphs Abs: 1.2 K/uL (ref 0.7–4.0)
MCHC: 33.3 g/dL (ref 30.0–36.0)
MCV: 93 fl (ref 78.0–100.0)
Monocytes Absolute: 0.5 K/uL (ref 0.1–1.0)
Monocytes Relative: 12.2 % — ABNORMAL HIGH (ref 3.0–12.0)
Neutro Abs: 1.9 K/uL (ref 1.4–7.7)
Neutrophils Relative %: 51.2 % (ref 43.0–77.0)
Platelets: 291 K/uL (ref 150.0–400.0)
RBC: 4.56 Mil/uL (ref 3.87–5.11)
RDW: 13.6 % (ref 11.5–15.5)
WBC: 3.7 K/uL — ABNORMAL LOW (ref 4.0–10.5)

## 2023-11-15 LAB — IBC + FERRITIN
Ferritin: 34.8 ng/mL (ref 10.0–291.0)
Iron: 112 ug/dL (ref 42–145)
Saturation Ratios: 39.6 % (ref 20.0–50.0)
TIBC: 282.8 ug/dL (ref 250.0–450.0)
Transferrin: 202 mg/dL — ABNORMAL LOW (ref 212.0–360.0)

## 2023-11-15 LAB — COMPREHENSIVE METABOLIC PANEL WITH GFR
ALT: 24 U/L (ref 0–35)
AST: 29 U/L (ref 0–37)
Albumin: 4.4 g/dL (ref 3.5–5.2)
Alkaline Phosphatase: 34 U/L — ABNORMAL LOW (ref 39–117)
BUN: 13 mg/dL (ref 6–23)
CO2: 30 meq/L (ref 19–32)
Calcium: 9.7 mg/dL (ref 8.4–10.5)
Chloride: 102 meq/L (ref 96–112)
Creatinine, Ser: 0.64 mg/dL (ref 0.40–1.20)
GFR: 86.43 mL/min (ref >60.00)
Glucose, Bld: 74 mg/dL (ref 70–99)
Potassium: 4.1 meq/L (ref 3.5–5.1)
Sodium: 140 meq/L (ref 135–145)
Total Bilirubin: 0.5 mg/dL (ref 0.2–1.2)
Total Protein: 7.3 g/dL (ref 6.0–8.3)

## 2023-11-15 LAB — LIPID PANEL
Cholesterol: 142 mg/dL (ref 0–200)
HDL: 58 mg/dL (ref >39.00)
LDL Cholesterol: 67 mg/dL (ref 0–99)
NonHDL: 84.13
Total CHOL/HDL Ratio: 2
Triglycerides: 86 mg/dL (ref 0.0–149.0)
VLDL: 17.2 mg/dL (ref 0.0–40.0)

## 2023-11-15 LAB — T3, FREE: T3, Free: 2.8 pg/mL (ref 2.3–4.2)

## 2023-11-15 LAB — T4, FREE: Free T4: 1.08 ng/dL (ref 0.60–1.60)

## 2023-11-15 LAB — TSH: TSH: 1.97 u[IU]/mL (ref 0.35–5.50)

## 2023-11-15 LAB — MAGNESIUM: Magnesium: 2.2 mg/dL (ref 1.5–2.5)

## 2023-11-15 LAB — HEMOGLOBIN A1C: Hgb A1c MFr Bld: 6 % (ref 4.6–6.5)

## 2023-11-15 LAB — VITAMIN B12: Vitamin B-12: 421 pg/mL (ref 211–911)

## 2023-11-16 ENCOUNTER — Ambulatory Visit: Payer: Self-pay | Admitting: Family Medicine

## 2023-11-16 NOTE — Progress Notes (Signed)
Labs great except: A1C(3 month average of sugars) is elevated.  This is considered PreDiabetes.  Work on diet-decrease sugars and starches and aim for 30 minutes of exercise 5 days/week to prevent progression to diabetes

## 2023-11-17 ENCOUNTER — Other Ambulatory Visit: Payer: Self-pay | Admitting: *Deleted

## 2023-11-17 ENCOUNTER — Ambulatory Visit: Admitting: Physician Assistant

## 2023-11-17 ENCOUNTER — Other Ambulatory Visit (INDEPENDENT_AMBULATORY_CARE_PROVIDER_SITE_OTHER)

## 2023-11-17 ENCOUNTER — Encounter: Payer: Self-pay | Admitting: Physician Assistant

## 2023-11-17 VITALS — BP 118/70 | HR 74 | Ht 62.0 in | Wt 120.5 lb

## 2023-11-17 DIAGNOSIS — E559 Vitamin D deficiency, unspecified: Secondary | ICD-10-CM

## 2023-11-17 DIAGNOSIS — D649 Anemia, unspecified: Secondary | ICD-10-CM | POA: Diagnosis not present

## 2023-11-17 DIAGNOSIS — K760 Fatty (change of) liver, not elsewhere classified: Secondary | ICD-10-CM

## 2023-11-17 DIAGNOSIS — Z800 Family history of malignant neoplasm of digestive organs: Secondary | ICD-10-CM | POA: Diagnosis not present

## 2023-11-17 DIAGNOSIS — Z860100 Personal history of colon polyps, unspecified: Secondary | ICD-10-CM

## 2023-11-17 DIAGNOSIS — D509 Iron deficiency anemia, unspecified: Secondary | ICD-10-CM

## 2023-11-17 DIAGNOSIS — R7401 Elevation of levels of liver transaminase levels: Secondary | ICD-10-CM

## 2023-11-17 DIAGNOSIS — K8689 Other specified diseases of pancreas: Secondary | ICD-10-CM

## 2023-11-17 DIAGNOSIS — R748 Abnormal levels of other serum enzymes: Secondary | ICD-10-CM

## 2023-11-17 LAB — SEDIMENTATION RATE: Sed Rate: 26 mm/h (ref 0–30)

## 2023-11-17 NOTE — Progress Notes (Signed)
 11/17/2023 Linda Mcmillan [id number] [birth year]  Referring provider: Wendolyn Jenkins Jansky, MD Primary GI doctor: Dr. 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  Must exclude other chronic causes of hepatocellular inflammation that can mimic fatty liver on ultrasound    Latest Ref Rng & Units 11/15/2023    8:37 AM 07/07/2023   12:16 PM 06/06/2023   12:32 PM  Hepatic Function  Total Protein 6.0 - 8.3 g/dL 7.3  7.5  7.4   Albumin 3.5 - 5.2 g/dL 4.4  4.5  4.2   AST 0 - 37 U/L 29  30  42   ALT 0 - 35 U/L 24  30  30    Alk Phosphatase 39 - 117 U/L 34  40  36   Total Bilirubin 0.2 - 1.2 mg/dL 0.5  0.5  0.7   Bilirubin, Direct 0.0 - 0.3 mg/dL  0.1     Platelets 708.9  FIB 4 1.98 Possible DILI due to past antibiotic use considered. Potential fatty liver contribution to AST elevation discussed. - Labs to include: hepatitis panel, IgG, ANA, Antismooth muscle antibody, AMA, celiac,  -will get elastography - need LFTs and CBC monitored every 6 months, - revaluation with imaging every 2-3 years.  -Continue to work on risk factor modification including diet exercise and control of risk factors including blood sugars. - Consider MRCP if new symptoms arise or if liver function tests remain abnormal.  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 no ampulla masses 08/13/2022 MRCP mild unchanged prominent central pancreatic duct tapering smoothly to ampulla, mild unchanged CBD measuring up to 0.9 again tapering smoothly to ampulla without calculus or obstruction, unchanged 0.7 cm fluid signal cystic lesion consistent small benign IPMN or pseudocyst no further characterization needed - has had repeated evaluation with last MRCP in 2024 without any changes, LFTs now normal, if AB US  is abnormal or repeat LFTs are abnormal suggest  repeat MRCP  Family history of colon cancer/personal history of polyps 11/2016 1 polyp recall 5 years 09/2021 colonoscopy 4 mm polyp, tics, IH  Anemia 11/15/2023  HGB 14.1 MCV 93.0 Platelets 291.0 11/15/2023 Iron 112 Ferritin 34.8 B12 421 Recent Labs    12/14/22 1042 06/06/23 1232 11/15/23 0837  HGB 13.5 13.8 14.1  Possibly early iron def versus from chronic inflammation Had normal colonoscopy 2023 Recheck iron in 1 month Consider hemoccult cards or further evaluation if low  Patient Care Team: Wendolyn Jenkins Jansky, MD as PCP - General (Family Medicine) Patel, Donika K, DO as Consulting Physician (Neurology)  HISTORY OF PRESENT ILLNESS: 73 y.o. female with a past medical history listed below presents for evaluation of elevated LFTs and multitude of symptoms.   Patient last seen in the office 07/30/2021 by Dr. Charlanne for dilated ducts pancreatic and CBD.    Discussed the use of AI scribe software for clinical note transcription with the patient, who gave verbal consent to proceed.  History of Present Illness   Linda Mcmillan is a 73 year old female who presents with recent changes in liver function tests. She has been undergoing regular blood tests every three months for two years, but has not had a specific liver enzyme test for almost six months. During a recent emergency room visit in April for an unrelated issue, her liver enzymes were found to be abnormal for the first  time in two years, with several values out of range. Subsequent blood tests showed a continued decrease in alkaline phosphatase levels and low transferrin levels, despite normal ferritin levels.  She has a history of restless leg syndrome and was taking iron supplements for about a year, which led to severe posterior epistaxis. The epistaxis resolved after discontinuing the supplements. She is concerned about her iron levels and her processing.  Over the past two to three months, she has experienced significant hair  shedding, particularly noticeable in the shower and when combing her hair. She has read that liver imbalances can affect hair growth and is concerned about this symptom.  In April, she experienced a severe headache following a stressful encounter with her son, which led to an emergency room visit. A brain scan and blood work were performed, but no significant findings were reported. She has not had contact with her son since the incident.  She has a history of urinary tract infections, which increased in frequency several years ago. After treatment with various antibiotics and following dietary recommendations, she has not had any UTIs since.  She mentions a recent diagnosis of prediabetes with an A1c of 6.0, despite being almost completely off sugar, not overweight, and maintaining regular exercise. She also notes a history of low white blood cell counts over the past five years, which have been transient and not significantly low.  No shortness of breath, chest discomfort, jaundice, nausea, vomiting, or significant abdominal pain. She occasionally experiences heartburn, particularly after consuming Indian chai tea, but denies any alcohol or tobacco use.      She  reports that she has never smoked. She has never used smokeless tobacco. She reports that she does not drink alcohol and does not use drugs.  RELEVANT GI HISTORY, IMAGING AND LABS: Results   LABS Alkaline Phosphatase: low Ferritin: normal Transferrin: low AST: 36 (05/2023) AST: 40 (06/2023) AST: 34 (11/2023) A1c: 6 WBC: 3.7 (11/2023) Monocytes: elevated (11/2023)  RADIOLOGY Brain CT: no abnormalities (05/2023)  DIAGNOSTIC Colonoscopy: normal (2023)     EGD/EUS 07/09/2020 ([place])-report sent for scanning  -No significant pathology in the pancreatic head, genu, body, tail and uncinate process.   -PD dilatation is likely an anatomic variant in setting of an ansa loop -PD 6 mm in the head of the pancreas, 4 mm genu and 2 mm  in the body of pancreas.   -Nl GB, ampulla,PV -Rpt EUS in 1 yr.   CT AP with contrast 06/15/2020 -Mildly dilated CBD (9 mm), PD (8 mm) with subtle hypoattenuating focus at the ampulla-negative present normal duodenal mucosa or underlying mass.  Correlate with lab values permitting obstruction.  Recommend GI consultation. -Mildly distended stomach.   US /USE 10/05/2020 -Fatty liver -Normal gallbladder. CBD 5.7 mm -Visualized portions of pancreas/normal. -Metavir score F1 consistent with mild fibrosis   Pt had 3 colonoscopies (first 1 had 1 polyp, repeat in 3 years.  Then 2 polyps , repeat in 5 years , then most recently October 2018-1 polyp , recommended to repeat in 5 years). Colon 11/2016- polyp CBC    Component Value Date/Time   WBC 3.7 (L) 11/15/2023 0837   RBC 4.56 11/15/2023 0837   HGB 14.1 11/15/2023 0837   HCT 42.4 11/15/2023 0837   PLT 291.0 11/15/2023 0837   MCV 93.0 11/15/2023 0837   MCH 30.9 06/06/2023 1232   MCHC 33.3 11/15/2023 0837   RDW 13.6 11/15/2023 0837   LYMPHSABS 1.2 11/15/2023 0837   MONOABS 0.5 11/15/2023 0837  EOSABS 0.2 11/15/2023 0837   BASOSABS 0.0 11/15/2023 0837   Recent Labs    12/14/22 1042 06/06/23 1232 11/15/23 0837  HGB 13.5 13.8 14.1    CMP     Component Value Date/Time   NA 140 11/15/2023 0837   K 4.1 11/15/2023 0837   CL 102 11/15/2023 0837   CO2 30 11/15/2023 0837   GLUCOSE 74 11/15/2023 0837   BUN 13 11/15/2023 0837   CREATININE 0.64 11/15/2023 0837   CALCIUM  9.7 11/15/2023 0837   PROT 7.3 11/15/2023 0837   ALBUMIN 4.4 11/15/2023 0837   AST 29 11/15/2023 0837   ALT 24 11/15/2023 0837   ALKPHOS 34 (L) 11/15/2023 0837   BILITOT 0.5 11/15/2023 0837   GFRNONAA >60 06/06/2023 1232      Latest Ref Rng & Units 11/15/2023    8:37 AM 07/07/2023   12:16 PM 06/06/2023   12:32 PM  Hepatic Function  Total Protein 6.0 - 8.3 g/dL 7.3  7.5  7.4   Albumin 3.5 - 5.2 g/dL 4.4  4.5  4.2   AST 0 - 37 U/L 29  30  42   ALT 0 - 35 U/L 24   30  30    Alk Phosphatase 39 - 117 U/L 34  40  36   Total Bilirubin 0.2 - 1.2 mg/dL 0.5  0.5  0.7   Bilirubin, Direct 0.0 - 0.3 mg/dL  0.1        Current Medications:   Current Outpatient Medications (Endocrine & Metabolic):    SYNTHROID  75 MCG tablet, TAKE 1 TABLET BY MOUTH DAILY  Current Outpatient Medications (Cardiovascular):    rosuvastatin  (CRESTOR ) 10 MG tablet, Take 1 tablet (10 mg total) by mouth daily.     Current Outpatient Medications (Other):    clonazePAM  (KLONOPIN ) 0.5 MG tablet, TAKE 1 TABLET BY MOUTH AT BEDTIME   estradiol  (ESTRACE ) 0.1 MG/GM vaginal cream, INSERT 0.5 GRAMS INTRAVAGINALLY 3 TIMES A WEEL   gabapentin  (NEURONTIN ) 600 MG tablet, Take 1 tablet at 6p and 8p.   rOPINIRole  (REQUIP ) 1 MG tablet, Take 1 tablet at 6p  Medical History:  Past Medical History:  Diagnosis Date   Anxiety 2018   Arthritis Neck, back (herniated lumbar disc   Atherosclerosis    Cataract Surgery, 2020   Depression 2016   Hyperlipidemia 10 years   Neuromuscular disorder (HCC) Restless leg syndrome, 10 years   Restless leg syndrome   Thyroid  disease 15 years   hypothyroidism   Allergies:  Allergies  Allergen Reactions   Hydrocodone Nausea And Vomiting and Nausea Only   Sulfa Antibiotics Nausea Only and Rash    Other reaction(s): Headache     Surgical History:  She  has a past surgical history that includes Abdominal hysterectomy (30 years ago); Eye surgery (Bilateral); cataract surgery (2020); EUS (07/09/2020); Colonoscopy (09/27/2021); Rhytidectomy neck / cheek / chin; Tubal ligation; and Cosmetic surgery (08/24). Family History:  Her family history includes ADD / ADHD in her son; Alcohol abuse in her father and son; Arthritis in her mother; Breast cancer in her paternal aunt; Cancer in her mother; Colon cancer in her mother; Depression in her son and son; Diabetes in her mother; Other in her father and son; Varicose Veins in her maternal grandmother and mother.  REVIEW  OF SYSTEMS  : All other systems reviewed and negative except where noted in the History of Present Illness.  PHYSICAL EXAM: BP 118/70 (BP Location: Left Arm, Patient Position: Sitting, Cuff Size: Normal)  Pulse 74   Ht 5' 2 (1.575 m)   Wt 120 lb 8 oz (54.7 kg)   BMI 22.04 kg/m  Physical Exam   GENERAL APPEARANCE: Well nourished, in no apparent distress HEENT: No cervical lymphadenopathy, unremarkable thyroid , sclerae anicteric, conjunctiva pink RESPIRATORY: Respiratory effort normal, BS equal bilateral without rales, rhonchi, wheezing CARDIO: RRR with no MRGs, peripheral pulses intact ABDOMEN: Soft, non distended, active bowel sounds in all 4 quadrants, no tenderness to palpation, no rebound, no mass appreciated RECTAL: declines MUSCULOSKELETAL: Full ROM, normal gait, without edema SKIN: Dry, intact without rashes or lesions. No jaundice. NEURO: Alert, oriented, no focal deficits PSYCH: Cooperative, normal mood and affect.      Alan JONELLE Coombs, PA-C 11:21 AM

## 2023-11-17 NOTE — Addendum Note (Signed)
 Addended by: CRAIG PALMA on: 11/17/2023 11:54 AM   Modules accepted: Orders

## 2023-11-17 NOTE — Patient Instructions (Signed)
 Your provider has requested that you go to the basement level for lab work before leaving today. Press B on the elevator. The lab is located at the first door on the left as you exit the elevator.  You have been scheduled for an RUQ abdominal ultrasound with  Elastography at [facility] (1st floor of hospital) on 11/22/2023 at 7:30am. Please arrive 30 minutes prior to your appointment for registration. Make certain not to have anything to eat or drink 8  hours prior to your appointment. Should you need to reschedule your appointment, please contact radiology at [phone]. This test typically takes about 30 minutes to perform.  You will need labs completed before your next appointment ( Liver Function Panel) Iron Panel)   Due to recent changes in healthcare laws, you may see the results of your imaging and laboratory studies on MyChart before your provider has had a chance to review them.  We understand that in some cases there may be results that are confusing or concerning to you. Not all laboratory results come back in the same time frame and the provider may be waiting for multiple results in order to interpret others.  Please give us  48 hours in order for your provider to thoroughly review all the results before contacting the office for clarification of your results.    I appreciate the  opportunity to care for you  Thank You   Alan Coombs, PA-C

## 2023-11-19 ENCOUNTER — Encounter: Payer: Self-pay | Admitting: Family Medicine

## 2023-11-20 ENCOUNTER — Telehealth: Payer: Self-pay | Admitting: Physician Assistant

## 2023-11-20 ENCOUNTER — Ambulatory Visit: Payer: Self-pay | Admitting: Physician Assistant

## 2023-11-20 ENCOUNTER — Other Ambulatory Visit: Payer: Self-pay | Admitting: Family

## 2023-11-20 DIAGNOSIS — E039 Hypothyroidism, unspecified: Secondary | ICD-10-CM

## 2023-11-20 DIAGNOSIS — K760 Fatty (change of) liver, not elsewhere classified: Secondary | ICD-10-CM

## 2023-11-20 DIAGNOSIS — R748 Abnormal levels of other serum enzymes: Secondary | ICD-10-CM

## 2023-11-20 DIAGNOSIS — K8689 Other specified diseases of pancreas: Secondary | ICD-10-CM

## 2023-11-20 DIAGNOSIS — K838 Other specified diseases of biliary tract: Secondary | ICD-10-CM

## 2023-11-20 MED ORDER — LEVOTHYROXINE SODIUM 75 MCG PO TABS: ORAL_TABLET | ORAL | 3 refills | Status: DC

## 2023-11-20 NOTE — Telephone Encounter (Signed)
 Patient called in again. Patient is concerned that she has active Hepatitis A and did not want to spread it to anyone at her US  appt. I informed patient that the Hep A antibody testing is not indicative of active infection. This means that patient has immunity either based on previous exposure or vaccination. Patient felt relief and was thankful for the information. Patient states that she will await remainder of results.

## 2023-11-20 NOTE — Telephone Encounter (Signed)
 Patient requesting to speak with a nurse in regards to results. Please advise.

## 2023-11-21 ENCOUNTER — Telehealth: Payer: Self-pay | Admitting: Orthopaedic Surgery

## 2023-11-21 ENCOUNTER — Other Ambulatory Visit (HOSPITAL_BASED_OUTPATIENT_CLINIC_OR_DEPARTMENT_OTHER): Payer: Self-pay | Admitting: Orthopaedic Surgery

## 2023-11-21 DIAGNOSIS — S838X1S Sprain of other specified parts of right knee, sequela: Secondary | ICD-10-CM

## 2023-11-21 NOTE — Telephone Encounter (Signed)
 Order placed to [facility] PT

## 2023-11-21 NOTE — Telephone Encounter (Signed)
 Patient said her PT referral  was never sent to [facility] street

## 2023-11-22 ENCOUNTER — Ambulatory Visit (HOSPITAL_COMMUNITY)
Admission: RE | Admit: 2023-11-22 | Discharge: 2023-11-22 | Disposition: A | Discharge status: Home / Self Care | Source: Ambulatory Visit | Attending: Physician Assistant | Admitting: Physician Assistant

## 2023-11-22 DIAGNOSIS — R748 Abnormal levels of other serum enzymes: Secondary | ICD-10-CM | POA: Diagnosis present

## 2023-11-22 DIAGNOSIS — K760 Fatty (change of) liver, not elsewhere classified: Secondary | ICD-10-CM | POA: Diagnosis present

## 2023-11-22 LAB — IGA: Immunoglobulin A: 284 mg/dL (ref 70–320)

## 2023-11-22 LAB — ANTI-SMOOTH MUSCLE ANTIBODY, IGG: Actin (Smooth Muscle) Antibody (IGG): <20 U (ref <20)

## 2023-11-22 LAB — VITAMIN D 1,25 DIHYDROXY
Vitamin D 1, 25 (OH)2 Total: 37 pg/mL (ref 18–72)
Vitamin D2 1, 25 (OH)2: <8 pg/mL
Vitamin D3 1, 25 (OH)2: 37 pg/mL

## 2023-11-22 LAB — MITOCHONDRIAL ANTIBODIES: Mitochondrial M2 Ab, IgG: <=20 U (ref <=20.0)

## 2023-11-22 LAB — ZINC: Zinc: 70 ug/dL (ref 60–130)

## 2023-11-22 LAB — ANTI-NUCLEAR AB-TITER (ANA TITER): ANA Titer 1: 1:80 {titer} — ABNORMAL HIGH

## 2023-11-22 LAB — HEPATITIS C ANTIBODY: Hepatitis C Ab: NONREACTIVE

## 2023-11-22 LAB — HEPATITIS B SURFACE ANTIGEN: Hepatitis B Surface Ag: NONREACTIVE

## 2023-11-22 LAB — TISSUE TRANSGLUTAMINASE, IGA: (tTG) Ab, IgA: <1 U/mL

## 2023-11-22 LAB — IGG: IgG (Immunoglobin G), Serum: 965 mg/dL (ref 600–1540)

## 2023-11-22 LAB — HEPATITIS A ANTIBODY, TOTAL: Hepatitis A AB,Total: REACTIVE — AB

## 2023-11-22 LAB — ANA: Anti Nuclear Antibody (ANA): POSITIVE — AB

## 2023-11-23 ENCOUNTER — Other Ambulatory Visit (INDEPENDENT_AMBULATORY_CARE_PROVIDER_SITE_OTHER)

## 2023-11-23 DIAGNOSIS — D509 Iron deficiency anemia, unspecified: Secondary | ICD-10-CM | POA: Diagnosis not present

## 2023-11-23 LAB — FECAL OCCULT BLOOD, IMMUNOCHEMICAL: Fecal Occult Bld: POSITIVE — AB

## 2023-11-24 ENCOUNTER — Ambulatory Visit (HOSPITAL_COMMUNITY)
Admission: RE | Admit: 2023-11-24 | Discharge: 2023-11-24 | Disposition: A | Discharge status: Home / Self Care | Source: Ambulatory Visit | Attending: Physician Assistant | Admitting: Physician Assistant

## 2023-11-24 ENCOUNTER — Ambulatory Visit: Payer: Self-pay | Admitting: Physician Assistant

## 2023-11-24 ENCOUNTER — Other Ambulatory Visit: Payer: Self-pay | Admitting: Physician Assistant

## 2023-11-24 DIAGNOSIS — K760 Fatty (change of) liver, not elsewhere classified: Secondary | ICD-10-CM

## 2023-11-24 DIAGNOSIS — K8689 Other specified diseases of pancreas: Secondary | ICD-10-CM | POA: Diagnosis present

## 2023-11-24 DIAGNOSIS — K838 Other specified diseases of biliary tract: Secondary | ICD-10-CM | POA: Diagnosis present

## 2023-11-24 DIAGNOSIS — R748 Abnormal levels of other serum enzymes: Secondary | ICD-10-CM

## 2023-11-24 MED ORDER — GADOBUTROL 1 MMOL/ML IV SOLN
7.0000 mL | Freq: Once | INTRAVENOUS | Status: AC | PRN
  Administered 2023-11-24: 7 mL via INTRAVENOUS

## 2023-11-29 ENCOUNTER — Ambulatory Visit (INDEPENDENT_AMBULATORY_CARE_PROVIDER_SITE_OTHER): Admitting: Physical Therapy

## 2023-11-29 ENCOUNTER — Encounter: Payer: Self-pay | Admitting: Physical Therapy

## 2023-11-29 DIAGNOSIS — M25561 Pain in right knee: Secondary | ICD-10-CM

## 2023-11-29 DIAGNOSIS — R29898 Other symptoms and signs involving the musculoskeletal system: Secondary | ICD-10-CM | POA: Diagnosis not present

## 2023-11-29 NOTE — Therapy (Signed)
 OUTPATIENT PHYSICAL THERAPY EVALUATION   Patient Name: Linda Mcmillan MRN: [id number] DOB:1950/03/10, 73 y.o., female Today's Date: 11/29/2023  END OF SESSION:  PT End of Session - 11/29/23 0927     Visit Number 1    Number of Visits 6    Date for Recertification  01/10/24    Authorization Type Medicare/AARP    Progress Note Due on Visit 10    PT Start Time 0845    PT Stop Time 0927    PT Time Calculation (min) 42 min    Activity Tolerance Patient tolerated treatment well    Behavior During Therapy [facility] for tasks assessed/performed          Past Medical History:  Diagnosis Date   Anxiety 2018   Arthritis Neck, back (herniated lumbar disc   Atherosclerosis    Cataract Surgery, 2020   Depression 2016   Hyperlipidemia 10 years   Neuromuscular disorder (HCC) Restless leg syndrome, 10 years   Restless leg syndrome   Thyroid  disease 15 years   hypothyroidism   Past Surgical History:  Procedure Laterality Date   ABDOMINAL HYSTERECTOMY  30 years ago   Partial   cataract surgery  2020   COLONOSCOPY  09/27/2021   [facility]   COSMETIC SURGERY  08/24   Chin/neck lift   EUS  07/09/2020   [facility]   EYE SURGERY Bilateral    RK, left eye nodule removed   RHYTIDECTOMY NECK / CHEEK / CHIN     TUBAL LIGATION     In my mid-30s   Patient Active Problem List   Diagnosis Date Noted   RLS (restless legs syndrome) 11/12/2023   Prediabetes 11/01/2021   Mixed hyperlipidemia 03/09/2021   Acquired hypothyroidism 03/09/2021   Adjustment disorder with depressed mood 03/09/2021   Postmenopausal estrogen deficiency 03/09/2021   Frequent UTI 03/09/2021   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   Atherosclerosis of aorta 03/09/2021   Hepatic steatosis 03/09/2021    PCP: Wendolyn Jenkins Jansky, MD  REFERRING PROVIDER: Genelle Standing, MD  REFERRING DIAG: [phone] (ICD-10-CM) - Meniscal injury, right, sequela  Rationale for Evaluation and Treatment:  Rehabilitation  THERAPY DIAG:  Acute pain of right knee - Plan: PT plan of care cert/re-cert  Other symptoms and signs involving the musculoskeletal system - Plan: PT plan of care cert/re-cert  ONSET DATE: March 2025   SUBJECTIVE:                                                                                                                                                                                           SUBJECTIVE STATEMENT: Linda Mcmillan  is a 74 y.o. female presents today with ongoing right knee pain particular about the medial joint line. She reports about 2 years ago she tripped over a concrete parking bunker; and had no issues for a year and a half.  She then began to develop Rt knee pain in March 2025.  PERTINENT HISTORY:  Anxiety, OA, depression, RLS, HLD, hypothyroidism  PAIN:  Are you having pain? Yes: NPRS scale: 0 currently; doesn't report pain more instability Pain location: Rt knee Pain description: instability  Aggravating factors: unknown Relieving factors: n/a  PRECAUTIONS:  None  RED FLAGS: None   WEIGHT BEARING RESTRICTIONS:  No  FALLS:  Has patient fallen in last 6 months? No  LIVING ENVIRONMENT: Lives with: lives alone Lives in: House/apartment Stairs: No  OCCUPATION:  Retired   PLOF:  Independent and Leisure: farmers markets, walking, going to [employer] 4 days/wk (strength training, recumbent bike)  PATIENT GOALS:  Moving to [place]  once house sells (after Christmas)   OBJECTIVE:  Note: Objective measures were completed at Evaluation unless otherwise noted.  DIAGNOSTIC FINDINGS:  MRI: small apical partial tear of the posterior horn the medial meniscus with a possible small centrally displaced fragment as above.  PATIENT SURVEYS:  Patient-Specific Activity Scoring Scheme  0 represents "unable to perform." 10 represents "able to perform at prior level. 0 1 2 3 4 5 6 7 8 9  10 (Date and Score)   Activity Eval     1.  Stability 8     2. Intermittent knee pain 4    3. Leg exercises 6   Score 6    Total score = sum of the activity scores/number of activities Minimum detectable change (90%CI) for average score = 2 points Minimum detectable change (90%CI) for single activity score = 3 points    COGNITIVE STATUS: Within functional limits for tasks assessed   SENSATION: WFL  POSTURE:  No Significant postural limitations  GAIT: 11/29/23 Comments: independent; no significant deviations noted   PALPATION: 11/29/23 mild tenderness along medial ant joint line as well as posterior knee   LOWER EXTREMITY ROM:     ROM Right eval Left eval  Knee flexion WNL WNL  Knee extension 5 5   (Blank rows = not tested)   LOWER EXTREMITY MMT:    MMT Right eval Left eval  Hip flexion    Hip extension 3/5 3/5  Hip abduction 3/5 3/5  Hip adduction    Hip internal rotation    Hip external rotation    Knee flexion    Knee extension 67.85# 54.55#  Ankle dorsiflexion    Ankle plantarflexion    Ankle inversion    Ankle eversion     (Blank rows = not tested)    FUNCTIONAL TESTS:  11/29/23 Able to rise independently without UE support   TREATMENT:  DATE:  11/29/23 TherEx See HEP - demonstrated with trial reps performed PRN, mod cues for comprehension   Self Care Educated on PT POC and clinical findings; exercises modifications and progressions    PATIENT EDUCATION:  Education details: HEP Person educated: Patient Education method: Programmer, multimedia, Facilities manager, and Handouts Education comprehension: verbalized understanding, returned demonstration, and needs further education  HOME EXERCISE PROGRAM: Access Code: [email or web address] URL: [email or web address] Date: 11/29/2023 Prepared by: Corean Ku  Exercises - Figure 4 Bridge  - 1 x daily - 7 x weekly  - 3 sets - 10 reps - Prone Hip Extension  - 1 x daily - 7 x weekly - 3 sets - 10 reps - Sidelying Hip Circles  - 2 x daily - 7 x weekly - 2 sets - 10 circles each way - Single Leg Knee Extension with Weight Machine  - 1 x daily - 7 x weekly - 3 sets - 10 reps   ASSESSMENT:  CLINICAL IMPRESSION: Patient is a 73 y.o. female who was seen today for physical therapy evaluation and treatment for Rt knee pain. She demonstrates mild strength deficits and continued feeling of instability with mild pain affecting functional mobility.  She will benefit from PT to address deficits listed.     OBJECTIVE IMPAIRMENTS: decreased mobility, decreased strength, and pain.   ACTIVITY LIMITATIONS: squatting, transfers, and locomotion level  PARTICIPATION LIMITATIONS: cleaning, laundry, shopping, and community activity  PERSONAL FACTORS: Age, Behavior pattern, Time since onset of injury/illness/exacerbation, and 3+ comorbidities: Anxiety, OA, depression, RLS, HLD, hypothyroidism are also affecting patient's functional outcome.   REHAB POTENTIAL: Good  CLINICAL DECISION MAKING: Stable/uncomplicated  EVALUATION COMPLEXITY: Low   GOALS: Goals reviewed with patient? Yes  SHORT TERM GOALS: Target date: 12/20/2023   Independent with initial HEP Goal status: INITIAL    LONG TERM GOALS: Target date:  01/10/2024   Independent with final HEP Goal status: INITIAL  2.  PSFS score improved by 2 points Goal status: INITIAL  3.  Report 50% improvement in instability episodes for improved mobility and confidence Goal status: INIITAL  4.  Bil hip strength improved to 4/5 for improved stability Goal status: INITIAL   PLAN:  PT FREQUENCY: 1x/week  PT DURATION: 6 weeks  PLANNED INTERVENTIONS: 97164- PT Re-evaluation, 97750- Physical Performance Testing, 97110-Therapeutic exercises, 97530- Therapeutic activity, [id number]- Neuromuscular re-education, 97535- Self Care, [id number]- Manual therapy, [id number]- Gait  training, [phone]- Aquatic Therapy, [phone]- Electrical stimulation (unattended), 97016- Vasopneumatic device, [id number]- Ultrasound, [id number]- Ionotophoresis 4mg /ml Dexamethasone, 79439 (1-2 muscles), 20561 (3+ muscles)- Dry Needling, Patient/Family education, Taping, Joint mobilization, Joint manipulation, Cryotherapy, and Moist heat.  PLAN FOR NEXT SESSION: Review HEP, progress hip strengthening; closed chain exercises   NEXT MD VISIT: 12/14/23   Corean JULIANNA Ku, PT 11/29/2023, 10:19 AM

## 2023-12-07 ENCOUNTER — Encounter: Payer: Self-pay | Admitting: Physical Therapy

## 2023-12-07 ENCOUNTER — Ambulatory Visit (INDEPENDENT_AMBULATORY_CARE_PROVIDER_SITE_OTHER): Admitting: Physical Therapy

## 2023-12-07 DIAGNOSIS — M25561 Pain in right knee: Secondary | ICD-10-CM

## 2023-12-07 DIAGNOSIS — R29898 Other symptoms and signs involving the musculoskeletal system: Secondary | ICD-10-CM | POA: Diagnosis not present

## 2023-12-07 NOTE — Therapy (Signed)
 OUTPATIENT PHYSICAL THERAPY TREATMENT   Patient Name: Linda Mcmillan MRN: [id number] DOB:05/04/1950, 73 y.o., female Today's Date: 12/07/2023  END OF SESSION:  PT End of Session - 12/07/23 0843     Visit Number 2    Number of Visits 6    Date for Recertification  01/10/24    Authorization Type Medicare/AARP    Progress Note Due on Visit 10    PT Start Time 0847    PT Stop Time 0927    PT Time Calculation (min) 40 min    Activity Tolerance Patient tolerated treatment well    Behavior During Therapy [facility] for tasks assessed/performed           Past Medical History:  Diagnosis Date   Anxiety 2018   Arthritis Neck, back (herniated lumbar disc   Atherosclerosis    Cataract Surgery, 2020   Depression 2016   Hyperlipidemia 10 years   Neuromuscular disorder (HCC) Restless leg syndrome, 10 years   Restless leg syndrome   Thyroid  disease 15 years   hypothyroidism   Past Surgical History:  Procedure Laterality Date   ABDOMINAL HYSTERECTOMY  30 years ago   Partial   cataract surgery  2020   COLONOSCOPY  09/27/2021   [facility]   COSMETIC SURGERY  08/24   Chin/neck lift   [facility]  07/09/2020   [facility]   EYE SURGERY Bilateral    RK, left eye nodule removed   RHYTIDECTOMY NECK / CHEEK / CHIN     TUBAL LIGATION     In my mid-30s   Patient Active Problem List   Diagnosis Date Noted   RLS (restless legs syndrome) 11/12/2023   Prediabetes 11/01/2021   Mixed hyperlipidemia 03/09/2021   Acquired hypothyroidism 03/09/2021   Adjustment disorder with depressed mood 03/09/2021   Postmenopausal estrogen deficiency 03/09/2021   Frequent UTI 03/09/2021   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   Atherosclerosis of aorta 03/09/2021   Hepatic steatosis 03/09/2021    PCP: Wendolyn Jenkins Jansky, MD  REFERRING PROVIDER: Genelle Standing, MD  REFERRING DIAG: [phone] (ICD-10-CM) - Meniscal injury, right, sequela  Rationale for Evaluation and Treatment:  Rehabilitation  THERAPY DIAG:  Acute pain of right knee  Other symptoms and signs involving the musculoskeletal system  ONSET DATE: March 2025   SUBJECTIVE:                                                                                                                                                                                           SUBJECTIVE STATEMENT: Did the knee extension and hamstring curl machines at the gym and after her knee was  aching more.    PERTINENT HISTORY:  Anxiety, OA, depression, RLS, HLD, hypothyroidism  PAIN:  Are you having pain? Yes: NPRS scale: 0 currently; doesn't report pain more instability Pain location: Rt knee Pain description: instability  Aggravating factors: unknown Relieving factors: n/a  PRECAUTIONS:  None  RED FLAGS: None   WEIGHT BEARING RESTRICTIONS:  No  FALLS:  Has patient fallen in last 6 months? No  LIVING ENVIRONMENT: Lives with: lives alone Lives in: House/apartment Stairs: No  OCCUPATION:  Retired   PLOF:  Independent and Leisure: farmers markets, walking, going to [employer] 4 days/wk (strength training, recumbent bike)  PATIENT GOALS:  Moving to [place]  once house sells (after Christmas)   OBJECTIVE:  Note: Objective measures were completed at Evaluation unless otherwise noted.  DIAGNOSTIC FINDINGS:  MRI: small apical partial tear of the posterior horn the medial meniscus with a possible small centrally displaced fragment as above.  PATIENT SURVEYS:  Patient-Specific Activity Scoring Scheme  0 represents "unable to perform." 10 represents "able to perform at prior level. 0 1 2 3 4 5 6 7 8 9  10 (Date and Score)   Activity Eval     1. Stability 8     2. Intermittent knee pain 4    3. Leg exercises 6   Score 6    Total score = sum of the activity scores/number of activities Minimum detectable change (90%CI) for average score = 2 points Minimum detectable change (90%CI) for single  activity score = 3 points    COGNITIVE STATUS: Within functional limits for tasks assessed   SENSATION: WFL  POSTURE:  No Significant postural limitations  GAIT: 11/29/23 Comments: independent; no significant deviations noted   PALPATION: 11/29/23 mild tenderness along medial ant joint line as well as posterior knee   LOWER EXTREMITY ROM:     ROM Right eval Left eval  Knee flexion WNL WNL  Knee extension 5 5   (Blank rows = not tested)   LOWER EXTREMITY MMT:    MMT Right eval Left eval  Hip flexion    Hip extension 3/5 3/5  Hip abduction 3/5 3/5  Hip adduction    Hip internal rotation    Hip external rotation    Knee flexion    Knee extension 67.85# 54.55#  Ankle dorsiflexion    Ankle plantarflexion    Ankle inversion    Ankle eversion     (Blank rows = not tested)    FUNCTIONAL TESTS:  11/29/23 Able to rise independently without UE support   TREATMENT:                                                                                                                              DATE:  12/07/23 TherAct NuStep L6 x 8 min Knee ext with 5# RLE only 3x10 Hamstring curl 15# 3x10 RLE only  TherAct Sit to/from stand with 15# KB 2x10 RDL 15# KB 3x10 - mod cues  for technique   11/29/23 TherEx See HEP - demonstrated with trial reps performed PRN, mod cues for comprehension   Self Care Educated on PT POC and clinical findings; exercises modifications and progressions    PATIENT EDUCATION:  Education details: HEP Person educated: Patient Education method: Programmer, multimedia, Facilities manager, and Handouts Education comprehension: verbalized understanding, returned demonstration, and needs further education  HOME EXERCISE PROGRAM: Access Code: [email or web address] URL: [email or web address] Date: 12/07/2023 Prepared by: Corean Ku  Exercises - Figure 4 Bridge  - 1 x daily - 7 x weekly - 3 sets - 10 reps - Prone Hip Extension  - 1 x daily - 7  x weekly - 3 sets - 10 reps - Sidelying Hip Circles  - 2 x daily - 7 x weekly - 2 sets - 10 circles each way - Single Leg Knee Extension with Weight Machine  - 1 x daily - 7 x weekly - 3 sets - 10 reps - Single Leg Hamstring Curl with Weight Machine  - 1 x daily - 7 x weekly - 3 sets - 10 reps - Kettlebell Deadlift  - 1 x daily - 7 x weekly - 3 sets - 10 reps - Kettlebell Squat  - 1 x daily - 7 x weekly - 3 sets - 10 reps   ASSESSMENT:  CLINICAL IMPRESSION: Pt tolerated session well today with progression of strengthening exercises as well additional gym exercises she can perform.  Did discuss modification with limited range to decrease pain with terminal knee extension.  Continue skilled PT.    OBJECTIVE IMPAIRMENTS: decreased mobility, decreased strength, and pain.   ACTIVITY LIMITATIONS: squatting, transfers, and locomotion level  PARTICIPATION LIMITATIONS: cleaning, laundry, shopping, and community activity  PERSONAL FACTORS: Age, Behavior pattern, Time since onset of injury/illness/exacerbation, and 3+ comorbidities: Anxiety, OA, depression, RLS, HLD, hypothyroidism are also affecting patient's functional outcome.   REHAB POTENTIAL: Good  CLINICAL DECISION MAKING: Stable/uncomplicated  EVALUATION COMPLEXITY: Low   GOALS: Goals reviewed with patient? Yes  SHORT TERM GOALS: Target date: 12/20/2023   Independent with initial HEP Goal status: INITIAL    LONG TERM GOALS: Target date:  01/10/2024   Independent with final HEP Goal status: INITIAL  2.  PSFS score improved by 2 points Goal status: INITIAL  3.  Report 50% improvement in instability episodes for improved mobility and confidence Goal status: INIITAL  4.  Bil hip strength improved to 4/5 for improved stability Goal status: INITIAL   PLAN:  PT FREQUENCY: 1x/week  PT DURATION: 6 weeks  PLANNED INTERVENTIONS: 97164- PT Re-evaluation, 97750- Physical Performance Testing, 97110-Therapeutic exercises,  97530- Therapeutic activity, [id number]- Neuromuscular re-education, 97535- Self Care, [id number]- Manual therapy, [id number]- Gait training, [phone]- Aquatic Therapy, [phone]- Electrical stimulation (unattended), 97016- Vasopneumatic device, [id number]- Ultrasound, [id number]- Ionotophoresis 4mg /ml Dexamethasone, 79439 (1-2 muscles), 20561 (3+ muscles)- Dry Needling, Patient/Family education, Taping, Joint mobilization, Joint manipulation, Cryotherapy, and Moist heat.  PLAN FOR NEXT SESSION: Review HE PRNP, progress hip strengthening; closed chain exercises   NEXT MD VISIT: 12/14/23   Corean JULIANNA Ku, PT, DPT 12/07/23 9:29 AM

## 2023-12-08 ENCOUNTER — Other Ambulatory Visit: Payer: Self-pay | Admitting: Family Medicine

## 2023-12-08 ENCOUNTER — Encounter (HOSPITAL_BASED_OUTPATIENT_CLINIC_OR_DEPARTMENT_OTHER): Payer: Self-pay

## 2023-12-08 DIAGNOSIS — E039 Hypothyroidism, unspecified: Secondary | ICD-10-CM

## 2023-12-08 MED ORDER — SYNTHROID 75 MCG PO TABS: ORAL_TABLET | ORAL | 3 refills | Status: AC

## 2023-12-08 NOTE — Telephone Encounter (Signed)
 FYI only for upcoming visit.

## 2023-12-12 ENCOUNTER — Ambulatory Visit: Admitting: Physical Therapy

## 2023-12-12 ENCOUNTER — Encounter: Payer: Self-pay | Admitting: Physical Therapy

## 2023-12-12 DIAGNOSIS — R29898 Other symptoms and signs involving the musculoskeletal system: Secondary | ICD-10-CM

## 2023-12-12 DIAGNOSIS — M25561 Pain in right knee: Secondary | ICD-10-CM | POA: Diagnosis not present

## 2023-12-12 NOTE — Therapy (Signed)
 OUTPATIENT PHYSICAL THERAPY TREATMENT   Patient Name: Linda Mcmillan MRN: [id number] DOB:05-09-1950, 73 y.o., female Today's Date: 12/12/2023  END OF SESSION:  PT End of Session - 12/12/23 0928     Visit Number 3    Number of Visits 6    Date for Recertification  01/10/24    Authorization Type Medicare/AARP    Progress Note Due on Visit 10    PT Start Time 0845    PT Stop Time 0926    PT Time Calculation (min) 41 min    Activity Tolerance Patient tolerated treatment well    Behavior During Therapy [name] for tasks assessed/performed            Past Medical History:  Diagnosis Date   Anxiety 2018   Arthritis Neck, back (herniated lumbar disc   Atherosclerosis    Cataract Surgery, 2020   Depression 2016   Hyperlipidemia 10 years   Neuromuscular disorder (HCC) Restless leg syndrome, 10 years   Restless leg syndrome   Thyroid  disease 15 years   hypothyroidism   Past Surgical History:  Procedure Laterality Date   ABDOMINAL HYSTERECTOMY  30 years ago   Partial   cataract surgery  2020   COLONOSCOPY  09/27/2021   [facility]   COSMETIC SURGERY  08/24   Chin/neck lift   [facility]  07/09/2020   [facility]   EYE SURGERY Bilateral    RK, left eye nodule removed   RHYTIDECTOMY NECK / CHEEK / CHIN     TUBAL LIGATION     In my mid-30s   Patient Active Problem List   Diagnosis Date Noted   RLS (restless legs syndrome) 11/12/2023   Prediabetes 11/01/2021   Mixed hyperlipidemia 03/09/2021   Acquired hypothyroidism 03/09/2021   Adjustment disorder with depressed mood 03/09/2021   Postmenopausal estrogen deficiency 03/09/2021   Frequent UTI 03/09/2021   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   Atherosclerosis of aorta 03/09/2021   Hepatic steatosis 03/09/2021    PCP: Wendolyn Jenkins Jansky, MD  REFERRING PROVIDER: Genelle Standing, MD  REFERRING DIAG: [phone] (ICD-10-CM) - Meniscal injury, right, sequela  Rationale for Evaluation and Treatment:  Rehabilitation  THERAPY DIAG:  Acute pain of right knee  Other symptoms and signs involving the musculoskeletal system  ONSET DATE: March 2025   SUBJECTIVE:                                                                                                                                                                                           SUBJECTIVE STATEMENT: Had more pain for 2 days following last session.  PERTINENT HISTORY:  Anxiety, OA,  depression, RLS, HLD, hypothyroidism  PAIN:  Are you having pain? Yes: NPRS scale: 0 currently; doesn't report pain more instability Pain location: Rt knee Pain description: instability  Aggravating factors: unknown Relieving factors: n/a  PRECAUTIONS:  None  RED FLAGS: None   WEIGHT BEARING RESTRICTIONS:  No  FALLS:  Has patient fallen in last 6 months? No  LIVING ENVIRONMENT: Lives with: lives alone Lives in: House/apartment Stairs: No  OCCUPATION:  Retired   PLOF:  Independent and Leisure: farmers markets, walking, going to [employer] 4 days/wk (strength training, recumbent bike)  PATIENT GOALS:  Moving to [place]  once house sells (after Christmas)   OBJECTIVE:  Note: Objective measures were completed at Evaluation unless otherwise noted.  DIAGNOSTIC FINDINGS:  MRI: small apical partial tear of the posterior horn the medial meniscus with a possible small centrally displaced fragment as above.  PATIENT SURVEYS:  Patient-Specific Activity Scoring Scheme  0 represents "unable to perform." 10 represents "able to perform at prior level. 0 1 2 3 4 5 6 7 8 9  10 (Date and Score)   Activity Eval     1. Stability 8     2. Intermittent knee pain 4    3. Leg exercises 6   Score 6    Total score = sum of the activity scores/number of activities Minimum detectable change (90%CI) for average score = 2 points Minimum detectable change (90%CI) for single activity score = 3 points    COGNITIVE STATUS: Within  functional limits for tasks assessed   SENSATION: WFL  POSTURE:  No Significant postural limitations  GAIT: 11/29/23 Comments: independent; no significant deviations noted   PALPATION: 11/29/23 mild tenderness along medial ant joint line as well as posterior knee   LOWER EXTREMITY ROM:     ROM Right eval Left eval  Knee flexion WNL WNL  Knee extension 5 5   (Blank rows = not tested)   LOWER EXTREMITY MMT:    MMT Right eval Left eval  Hip flexion    Hip extension 3/5 3/5  Hip abduction 3/5 3/5  Hip adduction    Hip internal rotation    Hip external rotation    Knee flexion    Knee extension 67.85# 54.55#  Ankle dorsiflexion    Ankle plantarflexion    Ankle inversion    Ankle eversion     (Blank rows = not tested)    FUNCTIONAL TESTS:  11/29/23 Able to rise independently without UE support   TREATMENT:                                                                                                                              DATE:  12/12/23 TherAct NuStep L6 x 8 min; LEs only Seated SLR 2x10; on Rt Standing hip abduction 2x10 bil; L4 band Standing hip extension 2x10 bil; L4 band Squats 2x10  12/07/23 TherAct NuStep L6 x 8 min Knee ext with 5# RLE only 3x10 Hamstring  curl 15# 3x10 RLE only  TherAct Sit to/from stand with 15# KB 2x10 RDL 15# KB 3x10 - mod cues for technique   11/29/23 TherEx See HEP - demonstrated with trial reps performed PRN, mod cues for comprehension   Self Care Educated on PT POC and clinical findings; exercises modifications and progressions    PATIENT EDUCATION:  Education details: HEP Person educated: Patient Education method: Programmer, Multimedia, Facilities Manager, and Handouts Education comprehension: verbalized understanding, returned demonstration, and needs further education  HOME EXERCISE PROGRAM: Access Code: [email or web address] URL: [email or web address] Date: 12/07/2023 Prepared by: Corean Ku  Exercises - Figure 4 Bridge  - 1 x daily - 7 x weekly - 3 sets - 10 reps - Prone Hip Extension  - 1 x daily - 7 x weekly - 3 sets - 10 reps - Sidelying Hip Circles  - 2 x daily - 7 x weekly - 2 sets - 10 circles each way - Single Leg Knee Extension with Weight Machine  - 1 x daily - 7 x weekly - 3 sets - 10 reps - Single Leg Hamstring Curl with Weight Machine  - 1 x daily - 7 x weekly - 3 sets - 10 reps - Kettlebell Deadlift  - 1 x daily - 7 x weekly - 3 sets - 10 reps - Kettlebell Squat  - 1 x daily - 7 x weekly - 3 sets - 10 reps   ASSESSMENT:  CLINICAL IMPRESSION: Pt tolerated session well today without increase in knee pain today.  Added to HEP.  Will see MD this week. Continue skilled PT.    OBJECTIVE IMPAIRMENTS: decreased mobility, decreased strength, and pain.   ACTIVITY LIMITATIONS: squatting, transfers, and locomotion level  PARTICIPATION LIMITATIONS: cleaning, laundry, shopping, and community activity  PERSONAL FACTORS: Age, Behavior pattern, Time since onset of injury/illness/exacerbation, and 3+ comorbidities: Anxiety, OA, depression, RLS, HLD, hypothyroidism are also affecting patient's functional outcome.   REHAB POTENTIAL: Good  CLINICAL DECISION MAKING: Stable/uncomplicated  EVALUATION COMPLEXITY: Low   GOALS: Goals reviewed with patient? Yes  SHORT TERM GOALS: Target date: 12/20/2023   Independent with initial HEP Goal status: INITIAL    LONG TERM GOALS: Target date:  01/10/2024   Independent with final HEP Goal status: INITIAL  2.  PSFS score improved by 2 points Goal status: INITIAL  3.  Report 50% improvement in instability episodes for improved mobility and confidence Goal status: INIITAL  4.  Bil hip strength improved to 4/5 for improved stability Goal status: INITIAL   PLAN:  PT FREQUENCY: 1x/week  PT DURATION: 6 weeks  PLANNED INTERVENTIONS: 97164- PT Re-evaluation, 97750- Physical Performance Testing,  97110-Therapeutic exercises, 97530- Therapeutic activity, [id number]- Neuromuscular re-education, 97535- Self Care, 02859- Manual therapy, [id number]- Gait training, [phone]- Aquatic Therapy, [phone]- Electrical stimulation (unattended), 97016- Vasopneumatic device, [id number]- Ultrasound, [id number]- Ionotophoresis 4mg /ml Dexamethasone, 79439 (1-2 muscles), 20561 (3+ muscles)- Dry Needling, Patient/Family education, Taping, Joint mobilization, Joint manipulation, Cryotherapy, and Moist heat.  PLAN FOR NEXT SESSION: what did MD say?, progress hip strengthening; closed chain exercises   NEXT MD VISIT: 12/14/23   Corean JULIANNA Ku, PT, DPT 12/12/23 9:28 AM

## 2023-12-13 ENCOUNTER — Ambulatory Visit (HOSPITAL_BASED_OUTPATIENT_CLINIC_OR_DEPARTMENT_OTHER): Admitting: Orthopaedic Surgery

## 2023-12-14 ENCOUNTER — Ambulatory Visit (INDEPENDENT_AMBULATORY_CARE_PROVIDER_SITE_OTHER): Admitting: Orthopaedic Surgery

## 2023-12-14 ENCOUNTER — Other Ambulatory Visit (HOSPITAL_BASED_OUTPATIENT_CLINIC_OR_DEPARTMENT_OTHER): Payer: Self-pay

## 2023-12-14 ENCOUNTER — Ambulatory Visit (HOSPITAL_BASED_OUTPATIENT_CLINIC_OR_DEPARTMENT_OTHER): Payer: Self-pay | Admitting: Orthopaedic Surgery

## 2023-12-14 DIAGNOSIS — S838X1S Sprain of other specified parts of right knee, sequela: Secondary | ICD-10-CM | POA: Diagnosis not present

## 2023-12-14 MED ORDER — ACETAMINOPHEN 500 MG PO TABS
500.0000 mg | ORAL_TABLET | Freq: Three times a day (TID) | ORAL | 0 refills | Status: AC
  Filled 2023-12-14: qty 30, 10d supply, fill #0

## 2023-12-14 MED ORDER — IBUPROFEN 800 MG PO TABS
800.0000 mg | ORAL_TABLET | Freq: Three times a day (TID) | ORAL | 0 refills | Status: AC
  Filled 2023-12-14: qty 30, 10d supply, fill #0

## 2023-12-14 MED ORDER — ASPIRIN 325 MG PO TBEC
325.0000 mg | DELAYED_RELEASE_TABLET | Freq: Every day | ORAL | 0 refills | Status: AC
  Filled 2023-12-14: qty 14, 14d supply, fill #0

## 2023-12-14 NOTE — Progress Notes (Signed)
 Chief Complaint: Right knee pain     History of Present Illness:   12/14/2023: Presents today for follow-up of her right knee.  She has still been having increasing pain particularly since beginning physical therapy  Linda Mcmillan is a 73 y.o. female presents today with ongoing right knee pain particular about the medial joint line.  This is more as she describes the achiness as opposed to pain.  This is about the medial tibiofemoral joint space.  There is an occasional giving out where the knee does buckle as well.  She has been seeing Dr. Joane who has performed 2 injections.  She did get some relief from these.  This is subsequently worn off.  She is moving to [place]  in the upcoming weeks.    PMH/PSH/Family History/Social History/Meds/Allergies:    Past Medical History:  Diagnosis Date   Anxiety 2018   Arthritis Neck, back (herniated lumbar disc   Atherosclerosis    Cataract Surgery, 2020   Depression 2016   Hyperlipidemia 10 years   Neuromuscular disorder (HCC) Restless leg syndrome, 10 years   Restless leg syndrome   Thyroid  disease 15 years   hypothyroidism   Past Surgical History:  Procedure Laterality Date   ABDOMINAL HYSTERECTOMY  30 years ago   Partial   cataract surgery  2020   COLONOSCOPY  09/27/2021   [facility]   COSMETIC SURGERY  08/24   Chin/neck lift   EUS  07/09/2020   [facility]   EYE SURGERY Bilateral    RK, left eye nodule removed   RHYTIDECTOMY NECK / CHEEK / CHIN     TUBAL LIGATION     In my mid-30s   Social History   Socioeconomic History   Marital status: Divorced    Spouse name: Not on file   Number of children: 2   Years of education: Not on file   Highest education level: Associate degree: academic program  Occupational History   Not on file  Tobacco Use   Smoking status: Never   Smokeless tobacco: Never  Vaping Use   Vaping status: Never Used  Substance and Sexual Activity   Alcohol use: Never   Drug  use: Never   Sexual activity: Not Currently  Other Topics Concern   Not on file  Social History Narrative   Right Handed    Lives in a one story home   Social Drivers of Health   Financial Resource Strain: Low Risk  (08/21/2023)   Overall Financial Resource Strain (CARDIA)    Difficulty of Paying Living Expenses: Not hard at all  Food Insecurity: No Food Insecurity (08/21/2023)   Hunger Vital Sign    Worried About Running Out of Food in the Last Year: Never true    Ran Out of Food in the Last Year: Never true  Transportation Needs: No Transportation Needs (08/21/2023)   PRAPARE - Administrator, Civil Service (Medical): No    Lack of Transportation (Non-Medical): No  Physical Activity: Sufficiently Active (08/21/2023)   Exercise Vital Sign    Days of Exercise per Week: 4 days    Minutes of Exercise per Session: 150+ min  Stress: Stress Concern Present (08/21/2023)   [employer] of Occupational Health - Occupational Stress Questionnaire    Feeling of Stress: To some extent  Social Connections: Socially Isolated (08/21/2023)   Social Connection and Isolation Panel    Frequency of Communication with Friends and Family: Three times a week  Frequency of Social Gatherings with Friends and Family: Twice a week    Attends Religious Services: Never    Database Administrator or Organizations: No    Attends Engineer, Structural: Not on file    Marital Status: Divorced   Family History  Problem Relation Age of Onset   Arthritis Mother    Cancer Mother        Colon Cancer at 88 years old   Diabetes Mother    Varicose Veins Mother    Colon cancer Mother    Alcohol abuse Father    Other Father        Heavy Smoker   Varicose Veins Maternal Grandmother    Other Son        PTSD from [place] War   ADD / ADHD Son    Alcohol abuse Son    Depression Son    Depression Son    Breast cancer Paternal Aunt    Rectal cancer Neg Hx    Stomach cancer Neg Hx    Pancreatic  cancer Neg Hx    Esophageal cancer Neg Hx    Allergies  Allergen Reactions   Hydrocodone Nausea And Vomiting and Nausea Only   Sulfa Antibiotics Nausea Only and Rash    Other reaction(s): Headache   Current Outpatient Medications  Medication Sig Dispense Refill   acetaminophen  (TYLENOL ) 500 MG tablet Take 1 tablet (500 mg total) by mouth every 8 (eight) hours for 10 days. 30 tablet 0   aspirin EC 325 MG tablet Take 1 tablet (325 mg total) by mouth daily. 14 tablet 0   ibuprofen (ADVIL) 800 MG tablet Take 1 tablet (800 mg total) by mouth every 8 (eight) hours for 10 days. Please take with food, please alternate with acetaminophen  30 tablet 0   clonazePAM  (KLONOPIN ) 0.5 MG tablet TAKE 1 TABLET BY MOUTH AT BEDTIME 90 tablet 1   estradiol  (ESTRACE ) 0.1 MG/GM vaginal cream INSERT 0.5 GRAMS INTRAVAGINALLY 3 TIMES A WEEL 42.5 g 2   gabapentin  (NEURONTIN ) 600 MG tablet Take 1 tablet at 6p and 8p. 180 tablet 3   levothyroxine  (SYNTHROID ) 75 MCG tablet TAKE 1 TABLET BY MOUTH DAILY 90 tablet 3   rOPINIRole  (REQUIP ) 1 MG tablet Take 1 tablet at 6p 90 tablet 3   rosuvastatin  (CRESTOR ) 10 MG tablet Take 1 tablet (10 mg total) by mouth daily. 90 tablet 3   SYNTHROID  75 MCG tablet TAKE 1 TABLET BY MOUTH DAILY 90 tablet 3   No current facility-administered medications for this visit.   No results found.  Review of Systems:   A ROS was performed including pertinent positives and negatives as documented in the HPI.  Physical Exam :   Constitutional: NAD and appears stated age Neurological: Alert and oriented Psych: Appropriate affect and cooperative There were no vitals taken for this visit.   Comprehensive Musculoskeletal Exam:    Right knee tenderness to palpation about the posterior medial joint space with a Baker's cyst, positive McMurray range of motion is from 0 to 130 degrees negative lateral joint line tenderness negative Lachman negative posterior drawer no varus valgus  laxity   Imaging:   Xray (3 views right knee): Normal  MRI (right knee): There is a posterior medial meniscal tear involving the posterior third without root involvement and minimal meniscal extrusion   I personally reviewed and interpreted the radiographs.   Assessment and Plan:   73 y.o. female with a posterior third medial meniscal tear.  I did describe that overall I do not believe that the root is destabilized and there is minimal meniscal extrusion without any chondral wear.  Given this we did discuss that she does have treatment options.  At this time she has tried physical therapy without any improvement and now with more persistent posterior medial based knee pain.  Given this I did discuss the possibility of a meniscal debridement with arthroscopy.  I discussed risks and limitations.  After discussion she would like to proceed  -Plan for right knee arthroscopy with medial meniscal debridement    After a lengthy discussion of treatment options, including risks, benefits, alternatives, complications of surgical and nonsurgical conservative options, the patient elected surgical repair.   The patient  is aware of the material risks  and complications including, but not limited to injury to adjacent structures, neurovascular injury, infection, numbness, bleeding, implant failure, thermal burns, stiffness, persistent pain, failure to heal, disease transmission from allograft, need for further surgery, dislocation, anesthetic risks, blood clots, risks of death,and others. The probabilities of surgical success and failure discussed with patient given their particular co-morbidities.The time and nature of expected rehabilitation and recovery was discussed.The patient's questions were all answered preoperatively.  No barriers to understanding were noted. I explained the natural history of the disease process and Rx rationale.  I explained to the patient what I considered to be reasonable  expectations given their personal situation.  The final treatment plan was arrived at through a shared patient decision making process model.   I personally saw and evaluated the patient, and participated in the management and treatment plan.  Elspeth Parker, MD Attending Physician, Orthopedic Surgery  This document was dictated using Dragon voice recognition software. A reasonable attempt at proof reading has been made to minimize errors.

## 2023-12-18 ENCOUNTER — Encounter: Payer: Self-pay | Admitting: Radiology

## 2023-12-19 ENCOUNTER — Encounter: Admitting: Physical Therapy

## 2023-12-20 ENCOUNTER — Other Ambulatory Visit: Payer: Self-pay | Admitting: Family Medicine

## 2023-12-20 ENCOUNTER — Telehealth: Payer: Self-pay | Admitting: Family Medicine

## 2023-12-20 NOTE — Telephone Encounter (Signed)
 Working on it will fax when Dr Wendolyn signs smk

## 2023-12-20 NOTE — Telephone Encounter (Signed)
 [facility] [place] faxed Surgical Clearance, to be filled out by provider. Patient requested to send it back via Fax within ASAP. Document is located in providers tray at front office.Please advise at [phone].

## 2023-12-21 ENCOUNTER — Telehealth (HOSPITAL_BASED_OUTPATIENT_CLINIC_OR_DEPARTMENT_OTHER): Payer: Self-pay | Admitting: Orthopaedic Surgery

## 2023-12-21 NOTE — Telephone Encounter (Signed)
 Patient wants to when her surgery will be sch. Best contact [phone]

## 2023-12-22 ENCOUNTER — Other Ambulatory Visit

## 2023-12-22 ENCOUNTER — Encounter: Admitting: Physical Therapy

## 2023-12-25 ENCOUNTER — Other Ambulatory Visit

## 2023-12-25 ENCOUNTER — Ambulatory Visit: Payer: Self-pay | Admitting: Physician Assistant

## 2023-12-25 DIAGNOSIS — K760 Fatty (change of) liver, not elsewhere classified: Secondary | ICD-10-CM

## 2023-12-25 DIAGNOSIS — R748 Abnormal levels of other serum enzymes: Secondary | ICD-10-CM | POA: Diagnosis not present

## 2023-12-25 DIAGNOSIS — D509 Iron deficiency anemia, unspecified: Secondary | ICD-10-CM | POA: Diagnosis not present

## 2023-12-25 LAB — HEPATIC FUNCTION PANEL
ALT: 21 U/L (ref 0–35)
AST: 28 U/L (ref 0–37)
Albumin: 4.3 g/dL (ref 3.5–5.2)
Alkaline Phosphatase: 38 U/L — ABNORMAL LOW (ref 39–117)
Bilirubin, Direct: 0.1 mg/dL (ref 0.0–0.3)
Total Bilirubin: 0.5 mg/dL (ref 0.2–1.2)
Total Protein: 6.7 g/dL (ref 6.0–8.3)

## 2023-12-25 LAB — IBC + FERRITIN
Ferritin: 37.4 ng/mL (ref 10.0–291.0)
Iron: 82 ug/dL (ref 42–145)
Saturation Ratios: 28 % (ref 20.0–50.0)
TIBC: 292.6 ug/dL (ref 250.0–450.0)
Transferrin: 209 mg/dL — ABNORMAL LOW (ref 212.0–360.0)

## 2023-12-25 NOTE — Telephone Encounter (Signed)
 I spoke with the aptient on 11/7 and scheduled her surgery for 01/15/24.

## 2023-12-26 ENCOUNTER — Encounter: Admitting: Physical Therapy

## 2023-12-27 ENCOUNTER — Ambulatory Visit (INDEPENDENT_AMBULATORY_CARE_PROVIDER_SITE_OTHER): Admitting: Cardiology

## 2023-12-27 ENCOUNTER — Encounter (HOSPITAL_BASED_OUTPATIENT_CLINIC_OR_DEPARTMENT_OTHER): Payer: Self-pay | Admitting: Cardiology

## 2023-12-27 VITALS — BP 124/72 | HR 74 | Ht 62.0 in | Wt 123.7 lb

## 2023-12-27 DIAGNOSIS — Z7189 Other specified counseling: Secondary | ICD-10-CM

## 2023-12-27 DIAGNOSIS — I700 Atherosclerosis of aorta: Secondary | ICD-10-CM | POA: Diagnosis not present

## 2023-12-27 DIAGNOSIS — Z712 Person consulting for explanation of examination or test findings: Secondary | ICD-10-CM | POA: Diagnosis not present

## 2023-12-27 DIAGNOSIS — K7581 Nonalcoholic steatohepatitis (NASH): Secondary | ICD-10-CM

## 2023-12-27 DIAGNOSIS — E7800 Pure hypercholesterolemia, unspecified: Secondary | ICD-10-CM

## 2023-12-27 MED ORDER — ROSUVASTATIN CALCIUM 10 MG PO TABS: 10.0000 mg | ORAL_TABLET | Freq: Every day | ORAL | 3 refills | Status: AC

## 2023-12-27 NOTE — Progress Notes (Signed)
 Cardiology Office Note:  .   Date:  12/27/2023  ID:  TAWONA FILSINGER, DOB 1950/03/21, MRN [id number] PCP: Wendolyn Jenkins Jansky, MD  [facility] Providers Cardiologist:  Shelda Bruckner, MD {  History of Present Illness: .   ANJELITA SHEAHAN is a 73 y.o. female with PMH aortic atherosclerosis, hyperlipidemia, RBBB. She was previously seen by Dr. Jordan and established care with me on 03/15/23.  CV history: see summary of prior external tests in Dr. Gib note from 04/13/2021. In summary, echo 2022 with EF 63%, mild TR. ETT 2022 13.4 METs, no angina, no ischemia.  Today: Overall doing well. Reviewed her diagnostic test results. She has had extensive recent lab workup from GI standpoint for MAFLD/MASH and CBD/PD dilation. ANA positive, 1:80, nuclear/speckled pattern, but remainder of autoimmune workup has been unremarkable. FOBT positive, transferrin low. Answered her questions to the best of my ability.  Also has questions regarding aortic aneurysm, it affects her son, spoke in generalities about this and recommended guidelines for surveillance.  She is moving back to [place]  in a few months.  ROS negative.  Studies Reviewed: SABRA    EKG:       Physical Exam:   VS:  BP 124/72   Pulse 74   Ht 5' 2 (1.575 m)   Wt 123 lb 11.2 oz (56.1 kg)   SpO2 97%   BMI 22.63 kg/m    Wt Readings from Last 3 Encounters:  12/27/23 123 lb 11.2 oz (56.1 kg)  11/17/23 120 lb 8 oz (54.7 kg)  11/10/23 118 lb (53.5 kg)    GEN: Well nourished, well developed in no acute distress HEENT: Normal, moist mucous membranes NECK: No JVD CARDIAC: regular rhythm, normal S1 and S2, no rubs or gallops. No murmur. VASCULAR: Radial and DP pulses 2+ bilaterally. No carotid bruits RESPIRATORY:  Clear to auscultation without rales, wheezing or rhonchi  ABDOMEN: Soft, non-tender, non-distended MUSCULOSKELETAL:  Ambulates independently SKIN: Warm and dry, no edema NEUROLOGIC:  Alert and oriented x 3. No focal  neuro deficits noted. PSYCHIATRIC:  Normal affect     ASSESSMENT AND PLAN: .    Aortic atherosclerosis Hyperlipidemia -we have had extensive scientific discussion regarding her prior workup. Reviewed some of the specific findings on her prior NMR lipid profile and [facility] diagnostic testing -discussed her prior particle number/particle size, findings on her extensive diagnostic panel, what has evidence and what does not -has had lpa checked as part of [facility] diagnostics, normal.  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 She also has upcoming follow up to review results -she has well controlled lipids, normal BMI, no diabetes. Good lifestyle. Typically would not consider her to be high risk for this. She is undergoing evaluation to make sure there is not an alternative diagnosis.  CV risk counseling and prevention -recommend heart healthy/Mediterranean diet, with whole grains, fruits, vegetable, fish, lean meats, nuts, and olive oil. Limit salt. -recommend moderate walking, 3-5 times/week for 30-50 minutes each session. Aim for at least 150 minutes.week. Goal should be pace of 3 miles/hours, or walking 1.5 miles in 30 minutes -recommend avoidance of tobacco products. Avoid excess alcohol.  Dispo: She is moving to [place]  in several months  Total time of encounter: I spent 44 minutes dedicated to the care of this patient on  the date of this encounter to include pre-visit review of records, face-to-face time with the patient discussing conditions above, and clinical documentation with the electronic health record. We specifically spent time today discussing cardiac and GI test results, recommendations, answered questions re: aortic aneurysm monitoring in general   Signed, Shelda Bruckner,  MD   Shelda Bruckner, MD, PhD, [facility]  [facility]    Heart & Vascular at [facility] at [facility] [address], [address] [place], [place] [address] [phone]

## 2023-12-27 NOTE — Patient Instructions (Signed)
 Medication Instructions:  No changes *If you need a refill on your cardiac medications before your next appointment, please call your pharmacy*  Lab Work: none If you have labs (blood work) drawn today and your tests are completely normal, you will receive your results only by: MyChart Message (if you have MyChart) OR A paper copy in the mail If you have any lab test that is abnormal or we need to change your treatment, we will call you to review the results.  Testing/Procedures: none  No follow up as patient is moving out of state.

## 2023-12-28 NOTE — Progress Notes (Unsigned)
 12/29/2023 Linda Mcmillan [id number] 06/08/1950  Referring provider: Wendolyn Jenkins Jansky, MD Primary GI doctor: Dr. 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    Latest Ref Rng & Units 12/25/2023    7:57 AM 11/15/2023    8:37 AM 07/07/2023   12:16 PM  Hepatic Function  Total Protein 6.0 - 8.3 g/dL 6.7  7.3  7.5   Albumin 3.5 - 5.2 g/dL 4.3  4.4  4.5   AST 0 - 37 U/L 28  29  30    ALT 0 - 35 U/L 21  24  30    Alk Phosphatase 39 - 117 U/L 38  34  40   Total Bilirubin 0.2 - 1.2 mg/dL 0.5  0.5  0.5   Bilirubin, Direct 0.0 - 0.3 mg/dL 0.1   0.1    Platelets 291.0  FIB 4 1.98 Serological workup negative 11/17/2023 11/22/2023 abdominal ultrasound with elastography K PA 3.3 dilated extrahepatic bile duct - need LFTs and CBC monitored every 6 months, - revaluation with imaging every 2-3 years.  -Continue to work on risk factor modification including diet exercise and control of risk factors including blood sugars.  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 no ampulla masses 08/13/2022 MRCP mild unchanged prominent central pancreatic duct tapering smoothly to ampulla, mild unchanged CBD measuring up to 0.9 again tapering smoothly to ampulla without calculus or obstruction, unchanged 0.7 cm fluid signal cystic lesion consistent small benign IPMN or pseudocyst no further characterization needed 11/24/2023 MRCP redemonstrated dilated extrahepatic bile ducts and well-circumscribed uncinate process IPMN of pancreas, dilatation and pancreatic head but no changes no obstruction further need for characterization  Family history of colon cancer/personal history of polyps 11/2016 1 polyp recall 5 years 09/2021 colonoscopy 4 mm TA polyp, tics, IH, recall 7 years  + FOBT likely hemorrhoids 11/15/2023  HGB 14.1 MCV 93.0 Platelets 291.0 11/15/2023 Iron 112 Ferritin 34.8 B12 421 12/25/2023 Iron 82 Ferritin 37.4 B12 421 Recent Labs    06/06/23 1232 11/15/23 0837   HGB 13.8 14.1  Possibly early iron def versus from chronic inflammation Had normal colonoscopy 2023, recall 7 years Rectal exam with hemorrhoids, will treat with hydrocortisone cream Call if any melena, hematochezia, etc Consider hemorrhoidal banding   Patient Care Team: Wendolyn Jenkins Jansky, MD as PCP - General (Family Medicine) Lonni Slain, MD as PCP - Cardiology (Cardiology) Patel, Donika K, DO as Consulting Physician (Neurology)  HISTORY OF PRESENT ILLNESS: 73 y.o. female with a past medical history listed below presents for evaluation of elevated LFTs and multitude of symptoms.   Patient last seen in the office 07/30/2021 by Dr. Charlanne for dilated ducts pancreatic and CBD.   I last saw the patient in the office 11/17/2023 for fatty liver and anemia.  Discussed the use of AI scribe software for clinical note transcription with the patient, who gave verbal consent to proceed.  History of Present Illness   Linda Mcmillan is a 73 year old female with a history of fatty liver who presents with concerns about blood in her stool.  She has a history of fatty liver, which she attributes to a lifetime of high sugar and carbohydrate intake. Over the past 10-15 years, she has significantly reduced her sugar intake. She was initially seen in October for her annual follow-up for fatty liver. Her transferrin levels are low, raising concerns about iron distribution, although her ferritin levels are within normal range at 37. Extensive testing  over the past month, including blood tests for autoimmune conditions, returned negative results.  She has a history of exposure to hepatitis A, likely from her ex-husband who had a severe case 47 years ago. During her pregnancy, she received a bilirubin shot to prevent her son from being born blind. Her hepatitis panel was negative. Her ANA test was abnormal at a low titer of 1:80, which is not considered significant without other positive findings.  She  has an anatomic anomaly with dilated bile ducts, which have been stable over the years.  She is concerned about blood in her stool, having had a colonoscopy two years ago which revealed one small tubular adenomatous polyp and internal hemorrhoids. She has developed an external hemorrhoid since then. No constipation and reports regular bowel movements without straining. No dark black stool. No pain associated with external hemorrhoid.  She is planning to move back to [place]  in January and has a history of a renowned gastroenterologist there who has managed her previous gastrointestinal issues.      She  reports that she has never smoked. She has never used smokeless tobacco. She reports that she does not drink alcohol and does not use drugs.  RELEVANT GI HISTORY, IMAGING AND LABS: Results   LABS Ferritin: 37 Hepatitis A Antibody: Positive ANA: 1:80  DIAGNOSTIC Colonoscopy: One tubular adenomatous polyp, internal hemorrhoid (2023)     EGD/EUS 07/09/2020 ([place])-report sent for scanning  -No significant pathology in the pancreatic head, genu, body, tail and uncinate process.   -PD dilatation is likely an anatomic variant in setting of an ansa loop -PD 6 mm in the head of the pancreas, 4 mm genu and 2 mm in the body of pancreas.   -Nl GB, ampulla,PV -Rpt EUS in 1 yr.   CT AP with contrast 06/15/2020 -Mildly dilated CBD (9 mm), PD (8 mm) with subtle hypoattenuating focus at the ampulla-negative present normal duodenal mucosa or underlying mass.  Correlate with lab values permitting obstruction.  Recommend GI consultation. -Mildly distended stomach.   US /USE 10/05/2020 -Fatty liver -Normal gallbladder. CBD 5.7 mm -Visualized portions of pancreas/normal. -Metavir score F1 consistent with mild fibrosis   Pt had 3 colonoscopies (first 1 had 1 polyp, repeat in 3 years.  Then 2 polyps , repeat in 5 years , then most recently October 2018-1 polyp , recommended to repeat in 5 years). Colon  11/2016- polyp CBC    Component Value Date/Time   WBC 3.7 (L) 11/15/2023 0837   RBC 4.56 11/15/2023 0837   HGB 14.1 11/15/2023 0837   HCT 42.4 11/15/2023 0837   PLT 291.0 11/15/2023 0837   MCV 93.0 11/15/2023 0837   MCH 30.9 06/06/2023 1232   MCHC 33.3 11/15/2023 0837   RDW 13.6 11/15/2023 0837   LYMPHSABS 1.2 11/15/2023 0837   MONOABS 0.5 11/15/2023 0837   EOSABS 0.2 11/15/2023 0837   BASOSABS 0.0 11/15/2023 0837   Recent Labs    06/06/23 1232 11/15/23 0837  HGB 13.8 14.1    CMP     Component Value Date/Time   NA 140 11/15/2023 0837   K 4.1 11/15/2023 0837   CL 102 11/15/2023 0837   CO2 30 11/15/2023 0837   GLUCOSE 74 11/15/2023 0837   BUN 13 11/15/2023 0837   CREATININE 0.64 11/15/2023 0837   CALCIUM  9.7 11/15/2023 0837   PROT 6.7 12/25/2023 0757   ALBUMIN 4.3 12/25/2023 0757   AST 28 12/25/2023 0757   ALT 21 12/25/2023 0757   ALKPHOS 38 (L)  12/25/2023 0757   BILITOT 0.5 12/25/2023 0757   GFRNONAA >60 06/06/2023 1232      Latest Ref Rng & Units 12/25/2023    7:57 AM 11/15/2023    8:37 AM 07/07/2023   12:16 PM  Hepatic Function  Total Protein 6.0 - 8.3 g/dL 6.7  7.3  7.5   Albumin 3.5 - 5.2 g/dL 4.3  4.4  4.5   AST 0 - 37 U/L 28  29  30    ALT 0 - 35 U/L 21  24  30    Alk Phosphatase 39 - 117 U/L 38  34  40   Total Bilirubin 0.2 - 1.2 mg/dL 0.5  0.5  0.5   Bilirubin, Direct 0.0 - 0.3 mg/dL 0.1   0.1       Current Medications:   Current Outpatient Medications (Endocrine & Metabolic):    SYNTHROID  75 MCG tablet, TAKE 1 TABLET BY MOUTH DAILY  Current Outpatient Medications (Cardiovascular):    rosuvastatin  (CRESTOR ) 10 MG tablet, Take 1 tablet (10 mg total) by mouth daily.   Current Outpatient Medications (Analgesics):    aspirin EC 325 MG tablet, Take 1 tablet (325 mg total) by mouth daily.   Current Outpatient Medications (Other):    clonazePAM  (KLONOPIN ) 0.5 MG tablet, TAKE 1 TABLET BY MOUTH AT BEDTIME   estradiol  (ESTRACE ) 0.1 MG/GM vaginal  cream, INSERT 0.5 GRAMS INTRAVAGINALLY 3 TIMES A WEEL   gabapentin  (NEURONTIN ) 600 MG tablet, Take 1 tablet at 6p and 8p.   hydrocortisone (ANUSOL-HC) 2.5 % rectal cream, Place 1 Application rectally 2 (two) times daily.   hydrocortisone (ANUSOL-HC) 25 MG suppository, Place 1 suppository (25 mg total) rectally 2 (two) times daily.   ketoconazole (NIZORAL) 2 % shampoo, Apply topically.   rOPINIRole  (REQUIP ) 1 MG tablet, Take 1 tablet at 6p  Medical History:  Past Medical History:  Diagnosis Date   Anxiety 2018   Arthritis Neck, back (herniated lumbar disc   Atherosclerosis    Cataract Surgery, 2020   Depression 2016   Hyperlipidemia 10 years   Neuromuscular disorder (HCC) Restless leg syndrome, 10 years   Restless leg syndrome   Thyroid  disease 15 years   hypothyroidism   Allergies:  Allergies  Allergen Reactions   Hydrocodone Nausea And Vomiting and Nausea Only   Sulfa Antibiotics Nausea Only and Rash    Other reaction(s): Headache     Surgical History:  She  has a past surgical history that includes Abdominal hysterectomy (30 years ago); Eye surgery (Bilateral); cataract surgery (2020); EUS (07/09/2020); Colonoscopy (09/27/2021); Rhytidectomy neck / cheek / chin; Tubal ligation; and Cosmetic surgery (08/24). Family History:  Her family history includes ADD / ADHD in her son; Alcohol abuse in her father and son; Arthritis in her mother; Breast cancer in her paternal aunt; Cancer in her mother; Colon cancer in her mother; Depression in her son and son; Diabetes in her mother; Other in her father and son; Varicose Veins in her maternal grandmother and mother.  REVIEW OF SYSTEMS  : All other systems reviewed and negative except where noted in the History of Present Illness.  PHYSICAL EXAM: BP 110/64   Pulse 85   Ht 5' 2 (1.575 m)   Wt 122 lb 6 oz (55.5 kg)   SpO2 98%   BMI 22.38 kg/m  Physical Exam   GENERAL APPEARANCE: Well nourished, in no apparent distress. HEENT: No  cervical lymphadenopathy, unremarkable thyroid , sclerae anicteric, conjunctiva pink. RESPIRATORY: Respiratory effort normal, breath sounds equal bilaterally without  rales, rhonchi, or wheezing. CARDIO: Regular rate and rhythm with no murmurs, rubs, or gallops, peripheral pulses intact. ABDOMEN: Soft, non-distended, active bowel sounds in all four quadrants, no tenderness to palpation, no rebound, no mass appreciated. RECTAL: Anus normal on inspection. Internal hemorrhoid visible. Hemorrhoidal skin tag or extra skin noted. MUSCULOSKELETAL: Full range of motion, normal gait, without edema. SKIN: Dry, intact without rashes or lesions. No jaundice. NEURO: Alert, oriented, no focal deficits. PSYCH: Cooperative, normal mood and affect.      Alan JONELLE Coombs, PA-C 11:08 AM

## 2023-12-29 ENCOUNTER — Ambulatory Visit: Admitting: Physician Assistant

## 2023-12-29 ENCOUNTER — Encounter: Payer: Self-pay | Admitting: Physician Assistant

## 2023-12-29 VITALS — BP 110/64 | HR 85 | Ht 62.0 in | Wt 122.4 lb

## 2023-12-29 DIAGNOSIS — R195 Other fecal abnormalities: Secondary | ICD-10-CM

## 2023-12-29 DIAGNOSIS — K838 Other specified diseases of biliary tract: Secondary | ICD-10-CM

## 2023-12-29 DIAGNOSIS — K649 Unspecified hemorrhoids: Secondary | ICD-10-CM

## 2023-12-29 DIAGNOSIS — K760 Fatty (change of) liver, not elsewhere classified: Secondary | ICD-10-CM | POA: Diagnosis not present

## 2023-12-29 DIAGNOSIS — K8689 Other specified diseases of pancreas: Secondary | ICD-10-CM | POA: Diagnosis not present

## 2023-12-29 MED ORDER — HYDROCORTISONE (PERIANAL) 2.5 % EX CREA: 1.0000 | TOPICAL_CREAM | Freq: Two times a day (BID) | CUTANEOUS | 2 refills | Status: AC

## 2023-12-29 MED ORDER — HYDROCORTISONE ACETATE 25 MG RE SUPP: 25.0000 mg | Freq: Two times a day (BID) | RECTAL | 0 refills | Status: DC

## 2023-12-29 NOTE — Patient Instructions (Addendum)
 Follow up as needed.  Please do sitz baths- these can be found at the pharmacy. It is a chief operating officer that is put in your toliet.  Please increase fiber or add benefiber, increase water and increase acitivity.  Will send in hydrocoritsone suppository, cheapest with GOODRX from [employer], [employer], [employer] or [employer] if your insurance does not pay for it. If the hemorrhoid suppository sent in is too expensive you can do this over the counter trick.  Apply a pea size amount of generic prescription Anusol HC cream that has been sent into your pharmacy to the tip of an over the counter PrepH suppository and insert rectally once every night for at least 7 nights.  If this does not improve there are procedures that can be done.   About Hemorrhoids  Hemorrhoids are swollen veins in the lower rectum and anus.  Also called piles, hemorrhoids are a common problem.  Hemorrhoids may be internal (inside the rectum) or external (around the anus).  Internal Hemorrhoids  Internal hemorrhoids are often painless, but they rarely cause bleeding.  The internal veins may stretch and fall down (prolapse) through the anus to the outside of the body.  The veins may then become irritated and painful.  External Hemorrhoids  External hemorrhoids can be easily seen or felt around the anal opening.  They are under the skin around the anus.  When the swollen veins are scratched or broken by straining, rubbing or wiping they sometimes bleed.  How Hemorrhoids Occur  Veins in the rectum and around the anus tend to swell under pressure.  Hemorrhoids can result from increased pressure in the veins of your anus or rectum.  Some sources of pressure are:  Straining to have a bowel movement because of constipation Waiting too long to have a bowel movement Coughing and sneezing often Sitting for extended periods of time, including on the toilet Diarrhea Obesity Trauma or injury to the anus Some liver  diseases Stress Family history of hemorrhoids Pregnancy  Pregnant women should try to avoid becoming constipated, because they are more likely to have hemorrhoids during pregnancy.  In the last trimester of pregnancy, the enlarged uterus may press on blood vessels and causes hemorrhoids.  In addition, the strain of childbirth sometimes causes hemorrhoids after the birth.  Symptoms of Hemorrhoids  Some symptoms of hemorrhoids include: Swelling and/or a tender lump around the anus Itching, mild burning and bleeding around the anus Painful bowel movements with or without constipation Bright red blood covering the stool, on toilet paper or in the toilet bowel.   Symptoms usually go away within a few days.  Always talk to your doctor about any bleeding to make sure it is not from some other causes.  Diagnosing and Treating Hemorrhoids  Diagnosis is made by an examination by your healthcare provider.  Special test can be performed by your doctor.    Most cases of hemorrhoids can be treated with: High-fiber diet: Eat more high-fiber foods, which help prevent constipation.  Ask for more detailed fiber information on types and sources of fiber from your healthcare provider. Fluids: Drink plenty of water.  This helps soften bowel movements so they are easier to pass. Sitz baths and cold packs: Sitting in lukewarm water two or three times a day for 15 minutes cleases the anal area and may relieve discomfort.  If the water is too hot, swelling around the anus will get worse.  Placing a cloth-covered ice pack on the anus for ten  minutes four times a day can also help reduce selling.  Gently pushing a prolapsed hemorrhoid back inside after the bath or ice pack can be helpful. Medications: For mild discomfort, your healthcare provider may suggest over-the-counter pain medication or prescribe a cream or ointment for topical use.  The cream may contain witch hazel, zinc oxide or petroleum jelly.  Medicated  suppositories are also a treatment option.  Always consult your doctor before applying medications or creams. Procedures and surgeries: There are also a number of procedures and surgeries to shrink or remove hemorrhoids in more serious cases.  Talk to your physician about these options.  You can often prevent hemorrhoids or keep them from becoming worse by maintaining a healthy lifestyle.  Eat a fiber-rich diet of fruits, vegetables and whole grains.  Also, drink plenty of water and exercise regularly.   2007, [employer] Doc.30  Toileting tips to help with your constipation - Drink at least 64-80 ounces of water/liquid per day. - Establish a time to try to move your bowels every day.  For many people, this is after a cup of coffee or after a meal such as breakfast. - Sit all of the way back on the toilet keeping your back fairly straight and while sitting up, try to rest the tops of your forearms on your upper thighs.   - Raising your feet with a step stool/squatty potty can be helpful to improve the angle that allows your stool to pass through the rectum. - Relax the rectum feeling it bulge toward the toilet water.  If you feel your rectum raising toward your body, you are contracting rather than relaxing. - Breathe in and slowly exhale. Belly breath by expanding your belly towards your belly button. Keep belly expanded as you gently direct pressure down and back to the anus.  A low pitched GRRR sound can assist with increasing intra-abdominal pressure.  (Can also trying to blow on a pinwheel and make it move, this helps with the same belly breathing) - Repeat 3-4 times. If unsuccessful, contract the pelvic floor to restore normal tone and get off the toilet.  Avoid excessive straining. - To reduce excessive wiping by teaching your anus to normally contract, place hands on outer aspect of knees and resist knee movement outward.  Hold 5-10 second then place hands just inside of knees  and resist inward movement of knees.  Hold 5 seconds.  Repeat a few times each way.  Go to the ER if unable to pass gas, severe AB pain, unable to hold down food, any shortness of breath of chest pain.  Thank you for trusting me with your gastrointestinal care!   Alan Coombs, PA-C  _______________________________________________________  If your blood pressure at your visit was 140/90 or greater, please contact your primary care physician to follow up on this.  _______________________________________________________  If you are age 44 or older, your body mass index should be between 23-30. Your Body mass index is 22.38 kg/m. If this is out of the aforementioned range listed, please consider follow up with your Primary Care Provider.  If you are age 74 or younger, your body mass index should be between 19-25. Your Body mass index is 22.38 kg/m. If this is out of the aformentioned range listed, please consider follow up with your Primary Care Provider.   ________________________________________________________  The [facility] providers would like to encourage you to use MYCHART to communicate with providers for non-urgent requests or questions.  Due to long  hold times on the telephone, sending your provider a message by [facility] may be a faster and more efficient way to get a response.  Please allow 48 business hours for a response.  Please remember that this is for non-urgent requests.  _______________________________________________________  [facility] is using a team-based approach to care.  Your team is made up of your doctor and two to three APPS. Our APPS (Nurse Practitioners and Physician Assistants) work with your physician to ensure care continuity for you. They are fully qualified to address your health concerns and develop a treatment plan. They communicate directly with your gastroenterologist to care for you. Seeing the Advanced Practice Practitioners on your  physician's team can help you by facilitating care more promptly, often allowing for earlier appointments, access to diagnostic testing, procedures, and other specialty referrals.

## 2024-01-02 ENCOUNTER — Encounter

## 2024-01-02 ENCOUNTER — Ambulatory Visit: Admitting: Physician Assistant

## 2024-01-08 ENCOUNTER — Ambulatory Visit (HOSPITAL_BASED_OUTPATIENT_CLINIC_OR_DEPARTMENT_OTHER)
Admission: RE | Admit: 2024-01-08 | Discharge: 2024-01-08 | Disposition: A | Discharge status: Home / Self Care | Source: Ambulatory Visit | Attending: Family Medicine | Admitting: Family Medicine

## 2024-01-08 DIAGNOSIS — Z780 Asymptomatic menopausal state: Secondary | ICD-10-CM | POA: Diagnosis present

## 2024-01-09 ENCOUNTER — Telehealth (HOSPITAL_BASED_OUTPATIENT_CLINIC_OR_DEPARTMENT_OTHER): Payer: Self-pay | Admitting: Orthopaedic Surgery

## 2024-01-09 ENCOUNTER — Encounter: Admitting: Physical Therapy

## 2024-01-09 NOTE — Telephone Encounter (Signed)
 Patient wants to know when to start taking meds for surgery

## 2024-01-10 ENCOUNTER — Ambulatory Visit: Payer: Self-pay | Admitting: Family

## 2024-01-10 NOTE — Telephone Encounter (Signed)
 Called and spoke to patient explaining all medications Dr. Genelle prescribed are for after surgery. Tylenol  and ibuprofen  can be as needed post op and aspirin  for 14 days post op as that is the only medication required to take the whole amount

## 2024-01-15 ENCOUNTER — Other Ambulatory Visit

## 2024-01-15 ENCOUNTER — Encounter: Payer: Self-pay | Admitting: Orthopaedic Surgery

## 2024-01-15 DIAGNOSIS — M23331 Other meniscus derangements, other medial meniscus, right knee: Secondary | ICD-10-CM | POA: Diagnosis not present

## 2024-01-15 DIAGNOSIS — M23341 Other meniscus derangements, anterior horn of lateral meniscus, right knee: Secondary | ICD-10-CM | POA: Diagnosis not present

## 2024-01-15 NOTE — Progress Notes (Signed)
 Date of Surgery: 01/15/2024  INDICATIONS: Linda Mcmillan is a 73 y.o.-year-old female with right knee medial and lateral meniscal tear.  The risk and benefits of the procedure were discussed in detail and documented in the pre-operative evaluation.   PREOPERATIVE DIAGNOSIS: 1. Right knee medial meniscal tear  POSTOPERATIVE DIAGNOSIS: Same. 2. Right knee lateral meniscal tear  PROCEDURE: 1. Right knee medial and lateral meniscal debridement  SURGEON: Elspeth LITTIE Parker MD  ASSISTANT: Conley Dawson, ATC  ANESTHESIA:  general  IV FLUIDS AND URINE: See anesthesia record.  ANTIBIOTICS: Ancef  ESTIMATED BLOOD LOSS: 5 mL.  IMPLANTS:  * No surgical log found *  DRAINS: None  CULTURES: None  COMPLICATIONS: none  DESCRIPTION OF PROCEDURE:   Examination under anesthesia: A careful examination under anesthesia was performed.  Knee ROM motion was: -3 - 135 Lachman: Normal Pivot Shift: Normal Posterior drawer: normal.   Varus stability in full extension: normal.   Varus stability in 30 degrees of flexion: normal.  Valgus stability in full extension: normal.   Valgus stability in 30 degrees of flexion: normal.  Posterolateral drawer: normal   Intra-operative findings: A thorough arthroscopic examination of the knee was performed.  The findings are: 1. Suprapatellar pouch: Normal 2. Undersurface of median ridge: Normal 3. Medial patellar facet: Normal 4. Lateral patellar facet: Normal 5. Trochlea: Normal 6. Lateral gutter/popliteus tendon: Normal 7. Hoffa's fat pad: Normal 8. Medial gutter/plica: Normal 9. ACL: Normal 10. PCL: Normal 11. Medial meniscus: Posterior tear white white tear near the root without root detachment 12. Medial compartment cartilage: Normal 13. Lateral meniscus: Mid body fraying and tearing at the white white junction 14. Lateral compartment cartilage: Normal  I identified the patient in the pre-operative holding area.  I marked the operative knee with my  initials. I reviewed the risks and benefits of the proposed surgical intervention and the patient wished to proceed.  Anesthesia performed a peripheral nerve block.  Patient was subsequently taken back to the operating room.  The patient was transferred to the operative suite and placed in the supine position with all bony prominences padded.     SCDs were placed on the non-operative lower extremity. Appropriate antibiotics was administered within 1 hour before incision. The operative lower extremity was then prepped and draped in standard fashion. A time out was performed confirming the correct extremity, correct patient and correct procedure.    A standard anterolateral portal was made with an 11 blade.  The ideal position for the anteromedial portal was established using a spinal needle.  This AM portal was then created under direct visualization with an 11 blade.  A full diagnostic arthroscopy was then performed, as described above, including probing of the chondral and meniscal surfaces.     The meniscus debridements were then performed.  Linda Mcmillan was introduced as well as a theatre manager and to both the medial lateral joint space and used to debride the meniscus back to healthy appearing bed.  The shaver was used to smooth this.   That concluded the case.  Skin was closed with 2-0 Vicryl and 3-0 nylon. Xeroform gauze, gauze, Tegaderm, Iceman and brace were applied.  Instrument, sponge, and needle counts were correct prior to wound closure and at the conclusion of the case.  The patient was taken to the PACU without complication     POSTOPERATIVE PLAN: She will be weightbearing and activity as tolerated.  She will be placed on aspirin  for blood clot prevention.  Elspeth LITTIE Parker, MD 10:04  AM

## 2024-01-18 ENCOUNTER — Encounter (HOSPITAL_BASED_OUTPATIENT_CLINIC_OR_DEPARTMENT_OTHER): Payer: Self-pay | Admitting: Orthopaedic Surgery

## 2024-01-18 ENCOUNTER — Telehealth (HOSPITAL_BASED_OUTPATIENT_CLINIC_OR_DEPARTMENT_OTHER): Payer: Self-pay | Admitting: Orthopaedic Surgery

## 2024-01-18 NOTE — Telephone Encounter (Signed)
 Responded via [employer]

## 2024-01-18 NOTE — Telephone Encounter (Signed)
 Patient states the bandage is slipping off of her surgery site. Please advise [phone]

## 2024-01-26 ENCOUNTER — Ambulatory Visit (INDEPENDENT_AMBULATORY_CARE_PROVIDER_SITE_OTHER): Admitting: Orthopaedic Surgery

## 2024-01-26 DIAGNOSIS — S838X1S Sprain of other specified parts of right knee, sequela: Secondary | ICD-10-CM

## 2024-01-26 NOTE — Progress Notes (Signed)
 Post Operative Evaluation    Procedure/Date of Surgery: Right knee medial meniscal debridement 12/1  Interval History:   Presents 2 weeks status post the above procedure.  She is continue to progress with range of motion.  She did somewhat overwork the knee 1 day but has been dialing back since then   PMH/PSH/Family History/Social History/Meds/Allergies:    Past Medical History:  Diagnosis Date   Anxiety 2018   Arthritis Neck, back (herniated lumbar disc   Atherosclerosis    Cataract Surgery, 2020   Depression 2016   Hyperlipidemia 10 years   Neuromuscular disorder (HCC) Restless leg syndrome, 10 years   Restless leg syndrome   Thyroid  disease 15 years   hypothyroidism   Past Surgical History:  Procedure Laterality Date   ABDOMINAL HYSTERECTOMY  30 years ago   Partial   cataract surgery  2020   COLONOSCOPY  09/27/2021   [facility]   COSMETIC SURGERY  08/24   [facility]/neck lift   EUS  07/09/2020   [facility]   EYE SURGERY Bilateral    RK, left eye nodule removed   RHYTIDECTOMY NECK / CHEEK / CHIN     TUBAL LIGATION     In my mid-30s   Social History   Socioeconomic History   Marital status: Divorced    Spouse name: Not on file   Number of children: 2   Years of education: Not on file   Highest education level: Associate degree: academic program  Occupational History   Not on file  Tobacco Use   Smoking status: Never   Smokeless tobacco: Never  Vaping Use   Vaping status: Never Used  Substance and Sexual Activity   Alcohol use: Never   Drug use: Never   Sexual activity: Not Currently  Other Topics Concern   Not on file  Social History Narrative   Right Handed    Lives in a one story home   Social Drivers of Health   Tobacco Use: Low Risk (12/29/2023)   Patient History    Smoking Tobacco Use: Never    Smokeless Tobacco Use: Never    Passive Exposure: Not on file  Financial Resource Strain: Low  Risk (08/21/2023)   Overall Financial Resource Strain (CARDIA)    Difficulty of Paying Living Expenses: Not hard at all  Food Insecurity: No Food Insecurity (08/21/2023)   Epic    Worried About Radiation Protection Practitioner of Food in the Last Year: Never true    Ran Out of Food in the Last Year: Never true  Transportation Needs: No Transportation Needs (08/21/2023)   Epic    Lack of Transportation (Medical): No    Lack of Transportation (Non-Medical): No  Physical Activity: Sufficiently Active (08/21/2023)   Exercise Vital Sign    Days of Exercise per Week: 4 days    Minutes of Exercise per Session: 150+ min  Stress: Stress Concern Present (08/21/2023)   [employer] of Occupational Health - Occupational Stress Questionnaire    Feeling of Stress: To some extent  Social Connections: Socially Isolated (08/21/2023)   Social Connection and Isolation Panel    Frequency of Communication with Friends and Family: Three times a week    Frequency of Social Gatherings with Friends and Family: Twice a week    Attends Religious Services: Never  Active Member of Clubs or Organizations: No    Attends Banker Meetings: Not on file    Marital Status: Divorced  Depression (PHQ2-9): Low Risk (08/22/2023)   Depression (PHQ2-9)    PHQ-2 Score: 0  Alcohol Screen: Not on file  Housing: Low Risk (08/21/2023)   Epic    Unable to Pay for Housing in the Last Year: No    Number of Times Moved in the Last Year: 0    Homeless in the Last Year: No  Utilities: Not At Risk (08/22/2023)   Epic    Threatened with loss of utilities: No  Health Literacy: Adequate Health Literacy (08/22/2023)   B1300 Health Literacy    Frequency of need for help with medical instructions: Never   Family History  Problem Relation Age of Onset   Arthritis Mother    Cancer Mother        Colon Cancer at 17 years old   Diabetes Mother    Varicose Veins Mother    Colon cancer Mother    Alcohol abuse Father    Other Father        Heavy Smoker    Varicose Veins Maternal Grandmother    Other Son        PTSD from [place] War   ADD / ADHD Son    Alcohol abuse Son    Depression Son    Depression Son    Breast cancer Paternal Aunt    Rectal cancer Neg Hx    Stomach cancer Neg Hx    Pancreatic cancer Neg Hx    Esophageal cancer Neg Hx    Allergies[1] Current Outpatient Medications  Medication Sig Dispense Refill   aspirin  EC 325 MG tablet Take 1 tablet (325 mg total) by mouth daily. 14 tablet 0   clonazePAM  (KLONOPIN ) 0.5 MG tablet TAKE 1 TABLET BY MOUTH AT BEDTIME 90 tablet 1   estradiol  (ESTRACE ) 0.1 MG/GM vaginal cream INSERT 0.5 GRAMS INTRAVAGINALLY 3 TIMES A WEEL 42.5 g 2   gabapentin  (NEURONTIN ) 600 MG tablet Take 1 tablet at 6p and 8p. 180 tablet 3   hydrocortisone  (ANUSOL -HC) 2.5 % rectal cream Place 1 Application rectally 2 (two) times daily. 30 g 2   hydrocortisone  (ANUSOL -HC) 25 MG suppository Place 1 suppository (25 mg total) rectally 2 (two) times daily. 12 suppository 0   ketoconazole (NIZORAL) 2 % shampoo Apply topically.     rOPINIRole  (REQUIP ) 1 MG tablet Take 1 tablet at 6p 90 tablet 3   rosuvastatin  (CRESTOR ) 10 MG tablet Take 1 tablet (10 mg total) by mouth daily. 90 tablet 3   SYNTHROID  75 MCG tablet TAKE 1 TABLET BY MOUTH DAILY 90 tablet 3   No current facility-administered medications for this visit.   No results found.  Review of Systems:   A ROS was performed including pertinent positives and negatives as documented in the HPI.   Musculoskeletal Exam:    There were no vitals taken for this visit.  Right knee incisions are well-appearing without erythema or drainage.  There are some swelling about the medial portal with some palpable swelling in the posterior lateral knee range of motion is from 0 to 90 degrees easily with nonantalgic gait  Imaging:      I personally reviewed and interpreted the radiographs.   Assessment:   2 weeks status post right knee arthroscopy doing well.  This time we  will plan to see her back in 4 weeks for reassessment  Plan :    -  Return to clinic for 4 weeks reassessment      I personally saw and evaluated the patient, and participated in the management and treatment plan.  Elspeth Parker, MD Attending Physician, Orthopedic Surgery  This document was dictated using Dragon voice recognition software. A reasonable attempt at proof reading has been made to minimize errors.    [1]  Allergies Allergen Reactions   Hydrocodone Nausea And Vomiting and Nausea Only   Sulfa Antibiotics Nausea Only and Rash    Other reaction(s): Headache

## 2024-02-21 ENCOUNTER — Other Ambulatory Visit: Payer: Self-pay | Admitting: Family Medicine

## 2024-02-21 ENCOUNTER — Encounter: Payer: Self-pay | Admitting: Family Medicine

## 2024-02-21 MED ORDER — ESTRADIOL 0.01 % VA CREA: TOPICAL_CREAM | VAGINAL | 3 refills | Status: AC

## 2024-02-23 ENCOUNTER — Other Ambulatory Visit (HOSPITAL_BASED_OUTPATIENT_CLINIC_OR_DEPARTMENT_OTHER): Payer: Self-pay

## 2024-02-23 ENCOUNTER — Ambulatory Visit (INDEPENDENT_AMBULATORY_CARE_PROVIDER_SITE_OTHER): Admitting: Orthopaedic Surgery

## 2024-02-23 DIAGNOSIS — S838X1D Sprain of other specified parts of right knee, subsequent encounter: Secondary | ICD-10-CM

## 2024-02-23 DIAGNOSIS — S838X1S Sprain of other specified parts of right knee, sequela: Secondary | ICD-10-CM | POA: Diagnosis not present

## 2024-02-23 MED ORDER — TRIAMCINOLONE ACETONIDE 40 MG/ML IJ SUSP
80.0000 mg | INTRAMUSCULAR | Status: AC | PRN
  Administered 2024-02-23: 80 mg via INTRA_ARTICULAR

## 2024-02-23 MED ORDER — LIDOCAINE HCL 1 % IJ SOLN
4.0000 mL | INTRAMUSCULAR | Status: AC | PRN
  Administered 2024-02-23: 4 mL

## 2024-02-23 NOTE — Progress Notes (Signed)
 "                                Post Operative Evaluation    Procedure/Date of Surgery: Right knee medial meniscal debridement 12/1  Interval History:   Presents 6 weeks status post the above procedure.  She has been dealing with more swelling significantly over the last couple weeks.  This has been making the knee quite stiff   PMH/PSH/Family History/Social History/Meds/Allergies:    Past Medical History:  Diagnosis Date   Anxiety 2018   Arthritis Neck, back (herniated lumbar disc   Atherosclerosis    Cataract Surgery, 2020   Depression 2016   Hyperlipidemia 10 years   Neuromuscular disorder (HCC) Restless leg syndrome, 10 years   Restless leg syndrome   Thyroid  disease 15 years   hypothyroidism   Past Surgical History:  Procedure Laterality Date   ABDOMINAL HYSTERECTOMY  30 years ago   Partial   cataract surgery  2020   COLONOSCOPY  09/27/2021   [facility]   COSMETIC SURGERY  08/24   Chin/neck lift   EUS  07/09/2020   [facility]   EYE SURGERY Bilateral    RK, left eye nodule removed   RHYTIDECTOMY NECK / CHEEK / CHIN     TUBAL LIGATION     In my mid-30s   Social History   Socioeconomic History   Marital status: Divorced    Spouse name: Not on file   Number of children: 2   Years of education: Not on file   Highest education level: Associate degree: academic program  Occupational History   Not on file  Tobacco Use   Smoking status: Never   Smokeless tobacco: Never  Vaping Use   Vaping status: Never Used  Substance and Sexual Activity   Alcohol use: Never   Drug use: Never   Sexual activity: Not Currently  Other Topics Concern   Not on file  Social History Narrative   Right Handed    Lives in a one story home   Social Drivers of Health   Tobacco Use: Low Risk (12/29/2023)   Patient History    Smoking Tobacco Use: Never    Smokeless Tobacco Use: Never    Passive Exposure: Not on file   Financial Resource Strain: Low Risk (08/21/2023)   Overall Financial Resource Strain (CARDIA)    Difficulty of Paying Living Expenses: Not hard at all  Food Insecurity: No Food Insecurity (08/21/2023)   Epic    Worried About Radiation Protection Practitioner of Food in the Last Year: Never true    Ran Out of Food in the Last Year: Never true  Transportation Needs: No Transportation Needs (08/21/2023)   Epic    Lack of Transportation (Medical): No    Lack of Transportation (Non-Medical): No  Physical Activity: Sufficiently Active (08/21/2023)   Exercise Vital Sign    Days of Exercise per Week: 4 days    Minutes of Exercise per Session: 150+ min  Stress: Stress Concern Present (08/21/2023)   [employer] of Occupational Health - Occupational Stress Questionnaire    Feeling of Stress: To some extent  Social Connections: Socially Isolated (08/21/2023)   Social Connection and Isolation Panel    Frequency of Communication with Friends and Family: Three times a week    Frequency of Social Gatherings with Friends and Family: Twice a week    Attends Religious Services: Never    Active  Member of Clubs or Organizations: No    Attends Engineer, Structural: Not on file    Marital Status: Divorced  Depression (PHQ2-9): Low Risk (08/22/2023)   Depression (PHQ2-9)    PHQ-2 Score: 0  Alcohol Screen: Not on file  Housing: Low Risk (08/21/2023)   Epic    Unable to Pay for Housing in the Last Year: No    Number of Times Moved in the Last Year: 0    Homeless in the Last Year: No  Utilities: Not At Risk (08/22/2023)   Epic    Threatened with loss of utilities: No  Health Literacy: Adequate Health Literacy (08/22/2023)   B1300 Health Literacy    Frequency of need for help with medical instructions: Never   Family History  Problem Relation Age of Onset   Arthritis Mother    Cancer Mother        Colon Cancer at 28 years old   Diabetes Mother    Varicose Veins Mother    Colon cancer Mother     Alcohol abuse Father    Other Father        Heavy Smoker   Varicose Veins Maternal Grandmother    Other Son        PTSD from [place] War   ADD / ADHD Son    Alcohol abuse Son    Depression Son    Depression Son    Breast cancer Paternal Aunt    Rectal cancer Neg Hx    Stomach cancer Neg Hx    Pancreatic cancer Neg Hx    Esophageal cancer Neg Hx    Allergies[1] Current Outpatient Medications  Medication Sig Dispense Refill   aspirin  EC 325 MG tablet Take 1 tablet (325 mg total) by mouth daily. 14 tablet 0   clonazePAM  (KLONOPIN ) 0.5 MG tablet TAKE 1 TABLET BY MOUTH AT BEDTIME 90 tablet 1   estradiol  (ESTRACE ) 0.01 % CREA vaginal cream 0.5 g intravaginally 3x/wk 85 g 3   estradiol  (ESTRACE ) 0.1 MG/GM vaginal cream INSERT 0.5 GRAMS INTRAVAGINALLY 3 TIMES A WEEL 42.5 g 2   gabapentin  (NEURONTIN ) 600 MG tablet Take 1 tablet at 6p and 8p. 180 tablet 3   hydrocortisone  (ANUSOL -HC) 2.5 % rectal cream Place 1 Application rectally 2 (two) times daily. 30 g 2   hydrocortisone  (ANUSOL -HC) 25 MG suppository Place 1 suppository (25 mg total) rectally 2 (two) times daily. 12 suppository 0   ketoconazole (NIZORAL) 2 % shampoo Apply topically.     rOPINIRole  (REQUIP ) 1 MG tablet Take 1 tablet at 6p 90 tablet 3   rosuvastatin  (CRESTOR ) 10 MG tablet Take 1 tablet (10 mg total) by mouth daily. 90 tablet 3   SYNTHROID  75 MCG tablet TAKE 1 TABLET BY MOUTH DAILY 90 tablet 3   No current facility-administered medications for this visit.   No results found.  Review of Systems:   A ROS was performed including pertinent positives and negatives as documented in the HPI.   Musculoskeletal Exam:    There were no vitals taken for this visit.  Right knee incisions are well-appearing without erythema or drainage.  There are some swelling about the medial portal with some palpable swelling in the posterior lateral knee range of motion is from 0 to 90 degrees easily with nonantalgic  gait  Imaging:      I personally reviewed and interpreted the radiographs.   Assessment:   6 weeks status post right knee arthroscopy/medial meniscal debridement with evidence of Hoffa's  irritation and swelling.  Given this I did recommend an office injection which she would like to proceed with today.  She is moving to [place]  at this time but we did discuss the possibility of communication through MyChart.  I do believe that this injection will ultimately improve her swelling definitively  Plan :    - Right knee injection provided after verbal consent obtained    Procedure Note  Patient: Linda Mcmillan             Date of Birth: [birth year]           MRN: [id number]             Visit Date: 02/23/2024  Procedures: Visit Diagnoses:  1. Meniscal injury, right, sequela     Large Joint Inj: R knee on 02/23/2024 12:10 PM Indications: pain Details: 22 G 1.5 in needle, ultrasound-guided anterior approach  Arthrogram: No  Medications: 4 mL lidocaine  1 %; 80 mg triamcinolone  acetonide 40 MG/ML Outcome: tolerated well, no immediate complications Procedure, treatment alternatives, risks and benefits explained, specific risks discussed. Consent was given by the patient. Immediately prior to procedure a time out was called to verify the correct patient, procedure, equipment, support staff and site/side marked as required. Patient was prepped and draped in the usual sterile fashion.            I personally saw and evaluated the patient, and participated in the management and treatment plan.  Elspeth Parker, MD Attending Physician, Orthopedic Surgery  This document was dictated using Dragon voice recognition software. A reasonable attempt at proof reading has been made to minimize errors.      [1] Allergies Allergen Reactions   Hydrocodone Nausea And Vomiting and Nausea Only   Sulfa Antibiotics Nausea Only and Rash    Other reaction(s): Headache  "

## 2024-02-27 ENCOUNTER — Encounter: Payer: Self-pay | Admitting: Neurology

## 2024-02-27 ENCOUNTER — Ambulatory Visit (INDEPENDENT_AMBULATORY_CARE_PROVIDER_SITE_OTHER): Admitting: Neurology

## 2024-02-27 DIAGNOSIS — G2581 Restless legs syndrome: Secondary | ICD-10-CM

## 2024-02-27 MED ORDER — CLONAZEPAM 0.5 MG PO TABS: 0.5000 mg | ORAL_TABLET | Freq: Every day | ORAL | 1 refills | Status: AC

## 2024-02-27 MED ORDER — ROPINIROLE HCL 1 MG PO TABS: ORAL_TABLET | ORAL | 3 refills | Status: AC

## 2024-02-27 MED ORDER — GABAPENTIN 600 MG PO TABS: ORAL_TABLET | ORAL | 3 refills | Status: AC

## 2024-02-27 NOTE — Progress Notes (Signed)
 "   Follow-up Visit   Date: 02/27/2024    TYAUNA LACAZE MRN: [id number] DOB: Sep 16, 1950    PETRA DUMLER is a 74 y.o. right-handed Caucasian female with hypertension and hyperlipidemia returning to the clinic for follow-up of restless leg syndrome.  The patient was accompanied to the clinic by self.   IMPRESSION/PLAN: Restless leg syndrome, controlled  - Previously tried:  pramipexole, oxycodone - Continue gabapentin  600mg  at 6p and 8p - Continue ropinirole  1mg  at 6pm - Continue clonazepam  0.5mg  at bedtime  She will establish care with neurologist in [place]  for ongoing management.   --------------------------------------------- History of present illness: For the last 10+ years, she has restless sensation of the feet.  Symptoms are worse at rest and improved with activity. She has previously tried: pramipexole, oxycodone and was followed at [facility] in 2016. After moving to [facility], [place]  she established care with Dr. Hope at [facility] and more recently followed by Dr. Curry.  Since around 2021, she has been on gabapentin  600mg  at 6p and 10p, ropinirole  1mg  at 6p, and clonazepam  0.5mg  at bedtime.  Most of the time (95%), her RLS is well-controlled on this regimen. She rarely needs to get up at night to walk around.  She moved to [place] from [place], [place]  in December 2023 and would like to establish care.  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 She lives alone.    UPDATE 03/14/2022:  She is here for follow-up visit.  Overall, restless leg is well-controlled on her current regimen.  Occasionally in the evenings, she needs to stretch the legs both she is able to sleep well through the night.    UPDATE 03/20/2023:  She is here for follow-up visit.  Her restless leg syndrome is very well-controlled on her current regimen.  She is needing refills. She is  planning a trip to [place] in May and thinking of moving back to [place] .  She has a lot of personal stressors involving the health of her two sons. She also had a chin lift several months ago and reports ongoing numbness of the face. Symptoms are slowly improving.   UPDATE 02/27/2024:  She is here for follow-up visit.  Her restless leg syndrome is controlled about 95% of the time.  She takes ropinirole  1mg  at 6p, gabapentin  600mg  at 6p and 8pm, and clonazepam  0.5mg  at 8pm.  She is moving to [place], [place]  on Sunday.    Medications:  Current Outpatient Medications on File Prior to Visit  Medication Sig Dispense Refill   aspirin  EC 325 MG tablet Take 1 tablet (325 mg total) by mouth daily. 14 tablet 0   clonazePAM  (KLONOPIN ) 0.5 MG tablet TAKE 1 TABLET BY MOUTH AT BEDTIME 90 tablet 1   estradiol  (ESTRACE ) 0.01 % CREA vaginal cream 0.5 g intravaginally 3x/wk 85 g 3   estradiol  (ESTRACE ) 0.1 MG/GM vaginal cream INSERT 0.5 GRAMS INTRAVAGINALLY 3 TIMES A WEEL 42.5 g 2   gabapentin  (NEURONTIN ) 600 MG tablet Take 1 tablet at 6p and 8p. 180 tablet 3   hydrocortisone  (ANUSOL -HC) 2.5 % rectal cream Place 1 Application rectally 2 (two) times daily. 30 g 2   ketoconazole (NIZORAL) 2 % shampoo Apply topically.     rOPINIRole  (REQUIP ) 1 MG tablet Take 1 tablet at 6p 90 tablet 3   rosuvastatin  (CRESTOR ) 10 MG tablet Take 1 tablet (10 mg total) by mouth daily. 90 tablet 3  SYNTHROID  75 MCG tablet TAKE 1 TABLET BY MOUTH DAILY 90 tablet 3   No current facility-administered medications on file prior to visit.    Allergies:  Allergies  Allergen Reactions   Hydrocodone Nausea And Vomiting and Nausea Only   Sulfa Antibiotics Nausea Only and Rash    Other reaction(s): Headache    Vital Signs:  BP 107/65   Pulse 88   Ht 5' 2 (1.575 m)   Wt 120 lb (54.4 kg)   SpO2 96%   BMI 21.95 kg/m    Neurological Exam: MENTAL STATUS including orientation to time, place, person, recent and remote memory, attention  span and concentration, language, and fund of knowledge is normal.  Speech is not dysarthric.  CRANIAL NERVES:  Pupils equal round and reactive to light.  Normal conjugate, extra-ocular eye movements in all directions of gaze.  No ptosis.  Face is symmetric. Facial sensation is intact. Palate elevates symmetrically.  Tongue is midline.    MOTOR:  Motor strength is 5/5 in all extremities.  No atrophy, fasciculations or abnormal movements.  No pronator drift.  Tone is normal.    MSRs:  Reflexes are 2+/4 throughout .  SENSORY:  Intact to vibration throughout .  COORDINATION/GAIT:  Normal finger-to- nose-finger.  Intact rapid alternating movements bilaterally.  Gait narrow based and stable.   Data: n/a     Thank you for allowing me to participate in patient's care.  If I can answer any additional questions, I would be pleased to do so.    Sincerely,    Yorley Buch K. Tobie, DO   "

## 2024-02-29 ENCOUNTER — Other Ambulatory Visit

## 2024-03-11 ENCOUNTER — Ambulatory Visit: Admitting: Neurology

## 2024-03-25 ENCOUNTER — Ambulatory Visit: Payer: Medicare Other | Admitting: Neurology

## 2024-08-27 ENCOUNTER — Ambulatory Visit
